# Patient Record
Sex: Female | Born: 1935 | Race: White | Hispanic: No | Smoking: Former smoker
Health system: Southern US, Community
[De-identification: ages and names within clinical notes are randomized; demographics above are authoritative.]

## PROBLEM LIST (undated history)

## (undated) DIAGNOSIS — I10 Essential (primary) hypertension: Secondary | ICD-10-CM

## (undated) DIAGNOSIS — D649 Anemia, unspecified: Secondary | ICD-10-CM

## (undated) DIAGNOSIS — R0600 Dyspnea, unspecified: Secondary | ICD-10-CM

## (undated) DIAGNOSIS — M1990 Unspecified osteoarthritis, unspecified site: Secondary | ICD-10-CM

## (undated) DIAGNOSIS — I739 Peripheral vascular disease, unspecified: Secondary | ICD-10-CM

## (undated) DIAGNOSIS — C801 Malignant (primary) neoplasm, unspecified: Secondary | ICD-10-CM

## (undated) HISTORY — PX: BREAST SURGERY: SHX581

## (undated) HISTORY — PX: KNEE SURGERY: SHX244

## (undated) HISTORY — PX: JOINT REPLACEMENT: SHX530

## (undated) HISTORY — DX: Unspecified osteoarthritis, unspecified site: M19.90

## (undated) HISTORY — PX: ABDOMINAL HYSTERECTOMY: SHX81

## (undated) HISTORY — PX: TONSILLECTOMY: SUR1361

## (undated) HISTORY — PX: OTHER SURGICAL HISTORY: SHX169

## (undated) HISTORY — PX: TUBAL LIGATION: SHX77

## (undated) HISTORY — PX: CARDIAC CATHETERIZATION: SHX172

## (undated) HISTORY — PX: CHOLECYSTECTOMY: SHX55

## (undated) HISTORY — PX: APPENDECTOMY: SHX54

## (undated) HISTORY — PX: CATARACT EXTRACTION: SUR2

## (undated) HISTORY — DX: Essential (primary) hypertension: I10

---

## 1968-12-30 HISTORY — PX: TUBAL LIGATION: SHX77

## 1983-12-31 HISTORY — PX: EYE SURGERY: SHX253

## 1994-12-30 HISTORY — PX: KNEE SURGERY: SHX244

## 2021-11-29 ENCOUNTER — Ambulatory Visit: Payer: Federal, State, Local not specified - PPO | Admitting: Orthopaedic Surgery

## 2021-11-29 ENCOUNTER — Encounter: Payer: Self-pay | Admitting: Orthopaedic Surgery

## 2021-11-29 ENCOUNTER — Other Ambulatory Visit: Payer: Self-pay

## 2021-11-29 DIAGNOSIS — M1612 Unilateral primary osteoarthritis, left hip: Secondary | ICD-10-CM | POA: Diagnosis not present

## 2021-11-29 NOTE — Progress Notes (Signed)
Office Visit Note   Patient: Meghan Miller           Date of Birth: [birth year]           MRN: [id number] Visit Date: 11/29/2021              Requested by: Neale Burly, MD [place],  [place] [address] PCP: Neale Burly, MD   Assessment & Plan: Visit Diagnoses:  1. Unilateral primary osteoarthritis, left hip     Plan: We discussed left total hip arthroplasty direct anterior approach.  She would require short-term skilled facility since she currently does not have an adult he could be with her after surgery.  She can find a neighbor or relative to help and this could be modified.  We discussed ambulation weightbearing as tolerated risks of surgery.  We discussed usual spinal anesthesia early ambulation aspirin DVT prophylaxis.  Questions were elicited and answered.  Decision for surgery was made.  Follow-Up Instructions: No follow-ups on file.   Orders:  No orders of the defined types were placed in this encounter.  No orders of the defined types were placed in this encounter.     Procedures: No procedures performed   Clinical Data: No additional findings.   Subjective: Chief Complaint  Patient presents with   Left Hip - Pain    HPI 85 year old female lives alone referred by Dr.Hasanaj for a left hip osteoarthritis.  She has bone-on-bone changes subchondral sclerosis and cystic formation with moderate opposite right hip arthritis by x-ray with right hip being asymptomatic.  Patient had taken some Aleve with some improvement was told to take Tylenol instead and she states that Tylenol does not not help.  She has had great difficulty walking with groin pain is difficulty getting straight.  Problems putting on her socks.  Pain radiates to the mid thigh.  X-rays listed below showed bone-on-bone changes of her hip.  She is using a cane due to the pain.  She states at times she has great difficulty walking in her house.  She is concerned about falling and  lives alone.  Patient's had left total knee and right total knee 1991 doing well previous hysterectomy gallbladder surgery.  Problems with bladder prolapse and states she had surgery for this with persistent problems.  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  She has had problems with cataracts hypertension in the past.  Some problems with back pain in the past.  Review of Systems all other systems noncontributory to HPI of note is hypertension cataract problems.   Objective: Vital Signs: Ht 5\' 4"  (1.626 m)   Wt 228 lb (103.4 kg)   BMI 39.14 kg/m   Physical Exam Constitutional:      Appearance: She is well-developed.  HENT:     Head: Normocephalic.     Right Ear: External ear normal.     Left Ear: External ear normal. There is no impacted cerumen.  Eyes:     Pupils: Pupils are equal, round, and reactive to light.  Neck:     Thyroid: No thyromegaly.     Trachea: No tracheal deviation.  Cardiovascular:     Rate and Rhythm: Normal rate.  Pulmonary:     Effort: Pulmonary effort is normal.  Abdominal:     Palpations: Abdomen is soft.  Musculoskeletal:     Cervical back: No rigidity.  Skin:    General: Skin is warm and dry.  Neurological:  Mental Status: She is alert and oriented to person, place, and time.  Psychiatric:        Behavior: Behavior normal.    Ortho Exam negative straight leg raising 90 degrees.  Reflexes are 2+ and symmetrical no sciatic notch tenderness.  10 degree internal rotation left hip with severe pain.  No pain with right hip range of motion.  Distal pulses are palpable.  Specialty Comments:  No specialty comments available.  Imaging: Narrative  CLINICAL DATA:  Left hip pain.   EXAM:  DG HIP (WITH OR WITHOUT PELVIS) 2-3V LEFT   COMPARISON:  None.   FINDINGS:  Both hips are normally located. Advanced left hip joint degenerative  changes and moderate to advanced right hip joint degenerative  changes. Significant joint  space narrowing, bony eburnation and  subchondral cystic change on the left. No fracture or AVN. The pubic  symphysis and SI joints are intact. No pelvic fractures or bone  lesions. Advanced degenerative disc disease noted in the lower  lumbar spine. Procedure Note  Pricilla Riffle, MD - 11/13/2021  Formatting of this note might be different from the original.  CLINICAL DATA:  Left hip pain.   EXAM:  DG HIP (WITH OR WITHOUT PELVIS) 2-3V LEFT   COMPARISON:  None.   FINDINGS:  Both hips are normally located. Advanced left hip joint degenerative  changes and moderate to advanced right hip joint degenerative  changes. Significant joint space narrowing, bony eburnation and  subchondral cystic change on the left. No fracture or AVN. The pubic  symphysis and SI joints are intact. No pelvic fractures or bone  lesions. Advanced degenerative disc disease noted in the lower  lumbar spine.   IMPRESSION:  Bilateral hip joint degenerative changes, left greater than right as  detailed above.   No acute bony findings.    Electronically Signed    By: Marijo Sanes M.D.    On: 11/13/2021 14:14   PMFS History: Patient Active Problem List   Diagnosis Date Noted   Unilateral primary osteoarthritis, left hip 11/29/2021   Past Medical History:  Diagnosis Date   Arthritis    Hypertension     No family history on file.  Past Surgical History:  Procedure Laterality Date   ABDOMINAL HYSTERECTOMY     bladder prolapse     CHOLECYSTECTOMY     JOINT REPLACEMENT     KNEE SURGERY     TUBAL LIGATION     Social History   Occupational History   Not on file  Tobacco Use   Smoking status: Former    Types: Cigarettes   Smokeless tobacco: Never  Substance and Sexual Activity   Alcohol use: Yes   Drug use: Not on file   Sexual activity: Not on file

## 2022-01-02 ENCOUNTER — Other Ambulatory Visit: Payer: Self-pay

## 2022-01-09 NOTE — Pre-Procedure Instructions (Signed)
Surgical Instructions    Your procedure is scheduled on Monday, January 16th.  Report to [facility] Main Entrance "A" at 09:00 A.M., then check in with the Admitting office.  Call this number if you have problems the morning of surgery:  [phone]   If you have any questions prior to your surgery date call [phone]: Open Monday-Friday 8am-4pm    Remember:  Do not eat after midnight the night before your surgery  You may drink clear liquids until 08:00 AM the morning of your surgery.   Clear liquids allowed are: Water, Non-Citrus Juices (without pulp), Carbonated Beverages, Clear Tea, Black Coffee Only, and Gatorade    Patient Instructions  The night before surgery:  No food after midnight. ONLY clear liquids after midnight  The day of surgery (if you do NOT have diabetes):  Drink ONE (1) Pre-Surgery Clear Ensure by 08:00 AM the morning of surgery. Drink in one sitting. Do not sip.  This drink was given to you during your hospital  pre-op appointment visit.  Nothing else to drink after completing the  Pre-Surgery Clear Ensure.         If you have questions, please contact your surgeons office.     Take these medicines the morning of surgery with A SIP OF WATER  carvedilol (COREG)   If needed: acetaminophen (TYLENOL)  fluticasone (FLONASE)   As of today, STOP taking any Aspirin (unless otherwise instructed by your surgeon) Aleve, Naproxen, Ibuprofen, Motrin, Advil, Goody's, BC's, all herbal medications, fish oil, and all vitamins.                     Do NOT Smoke (Tobacco/Vaping) or drink Alcohol 24 hours prior to your procedure.  If you use a CPAP at night, you may bring all equipment for your overnight stay.   Contacts, glasses, piercing's, hearing aid's, dentures or partials may not be worn into surgery, please bring cases for these belongings.    For patients admitted to the hospital, discharge time will be determined by your treatment team.   Patients  discharged the day of surgery will not be allowed to drive home, and someone needs to stay with them for 24 hours.  NO VISITORS WILL BE ALLOWED IN PRE-OP WHERE PATIENTS GET READY FOR SURGERY.  ONLY 1 SUPPORT PERSON MAY BE PRESENT IN THE WAITING ROOM WHILE YOU ARE IN SURGERY.  IF YOU ARE TO BE ADMITTED, ONCE YOU ARE IN YOUR ROOM YOU WILL BE ALLOWED TWO (2) VISITORS.  Minor children may have two parents present. Special consideration for safety and communication needs will be reviewed on a case by case basis.   Special instructions:   - Preparing For Surgery  Before surgery, you can play an important role. Because skin is not sterile, your skin needs to be as free of germs as possible. You can reduce the number of germs on your skin by washing with CHG (chlorahexidine gluconate) Soap before surgery.  CHG is an antiseptic cleaner which kills germs and bonds with the skin to continue killing germs even after washing.    Oral Hygiene is also important to reduce your risk of infection.  Remember - BRUSH YOUR TEETH THE MORNING OF SURGERY WITH YOUR REGULAR TOOTHPASTE  Please do not use if you have an allergy to CHG or antibacterial soaps. If your skin becomes reddened/irritated stop using the CHG.  Do not shave (including legs and underarms) for at least 48 hours prior to first CHG  shower. It is OK to shave your face.  Please follow these instructions carefully.   Shower the NIGHT BEFORE SURGERY and the MORNING OF SURGERY  If you chose to wash your hair, wash your hair first as usual with your normal shampoo.  After you shampoo, rinse your hair and body thoroughly to remove the shampoo.  Use CHG Soap as you would any other liquid soap. You can apply CHG directly to the skin and wash gently with a scrungie or a clean washcloth.   Apply the CHG Soap to your body ONLY FROM THE NECK DOWN.  Do not use on open wounds or open sores. Avoid contact with your eyes, ears, mouth and genitals (private  parts). Wash Face and genitals (private parts)  with your normal soap.   Wash thoroughly, paying special attention to the area where your surgery will be performed.  Thoroughly rinse your body with warm water from the neck down.  DO NOT shower/wash with your normal soap after using and rinsing off the CHG Soap.  Pat yourself dry with a CLEAN TOWEL.  Wear CLEAN PAJAMAS to bed the night before surgery  Place CLEAN SHEETS on your bed the night before your surgery  DO NOT SLEEP WITH PETS.   Day of Surgery: Shower with CHG soap. Do not wear jewelry, make up, nail polish, gel polish, artificial nails, or any other type of covering on natural nails including finger and toenails. If patients have artificial nails, gel coating, etc. that need to be removed by a nail salon please have this removed prior to surgery. Surgery may need to be canceled/delayed if the surgeon/ anesthesia feels like the patient is unable to be adequately monitored. Do not wear lotions, powders, perfumes, or deodorant. Do not shave 48 hours prior to surgery.   Do not bring valuables to the hospital. [facility] is not responsible for any belongings or valuables. Wear Clean/Comfortable clothing the morning of surgery Remember to brush your teeth WITH YOUR REGULAR TOOTHPASTE.   Please read over the following fact sheets that you were given.   3 days prior to your procedure or After your COVID test   You are not required to quarantine however you are required to wear a well-fitting mask when you are out and around people not in your household. If your mask becomes wet or soiled, replace with a new one.   Wash your hands often with soap and water for 20 seconds or clean your hands with an alcohol-based hand sanitizer that contains at least 60% alcohol.   Do not share personal items.   Notify your provider:  o if you are in close contact with someone who has COVID  o or if you develop a fever of 100.4 or  greater, sneezing, cough, sore throat, shortness of breath or body aches.

## 2022-01-10 ENCOUNTER — Encounter (HOSPITAL_COMMUNITY): Payer: Self-pay

## 2022-01-10 ENCOUNTER — Other Ambulatory Visit: Payer: Self-pay

## 2022-01-10 ENCOUNTER — Encounter: Payer: Self-pay | Admitting: Surgery

## 2022-01-10 ENCOUNTER — Ambulatory Visit (HOSPITAL_COMMUNITY): Admission: RE | Admit: 2022-01-10 | Payer: Federal, State, Local not specified - PPO | Source: Ambulatory Visit

## 2022-01-10 ENCOUNTER — Encounter (HOSPITAL_COMMUNITY)
Admission: RE | Admit: 2022-01-10 | Discharge: 2022-01-10 | Disposition: A | Discharge status: Home / Self Care | Payer: Federal, State, Local not specified - PPO | Source: Ambulatory Visit | Attending: Orthopaedic Surgery | Admitting: Orthopaedic Surgery

## 2022-01-10 ENCOUNTER — Ambulatory Visit (INDEPENDENT_AMBULATORY_CARE_PROVIDER_SITE_OTHER): Payer: Federal, State, Local not specified - PPO | Admitting: Surgery

## 2022-01-10 VITALS — BP 138/64 | HR 73 | Temp 98.1°F

## 2022-01-10 DIAGNOSIS — Z87891 Personal history of nicotine dependence: Secondary | ICD-10-CM | POA: Diagnosis not present

## 2022-01-10 DIAGNOSIS — Z01818 Encounter for other preprocedural examination: Secondary | ICD-10-CM | POA: Insufficient documentation

## 2022-01-10 DIAGNOSIS — M1612 Unilateral primary osteoarthritis, left hip: Secondary | ICD-10-CM

## 2022-01-10 DIAGNOSIS — Z85828 Personal history of other malignant neoplasm of skin: Secondary | ICD-10-CM | POA: Diagnosis not present

## 2022-01-10 DIAGNOSIS — R0600 Dyspnea, unspecified: Secondary | ICD-10-CM | POA: Insufficient documentation

## 2022-01-10 DIAGNOSIS — Z20822 Contact with and (suspected) exposure to covid-19: Secondary | ICD-10-CM | POA: Insufficient documentation

## 2022-01-10 DIAGNOSIS — I10 Essential (primary) hypertension: Secondary | ICD-10-CM | POA: Diagnosis not present

## 2022-01-10 DIAGNOSIS — I2510 Atherosclerotic heart disease of native coronary artery without angina pectoris: Secondary | ICD-10-CM | POA: Diagnosis not present

## 2022-01-10 HISTORY — DX: Malignant (primary) neoplasm, unspecified: C80.1

## 2022-01-10 HISTORY — DX: Dyspnea, unspecified: R06.00

## 2022-01-10 LAB — TYPE AND SCREEN
ABO/RH(D): A NEG
Antibody Screen: NEGATIVE

## 2022-01-10 LAB — SARS CORONAVIRUS 2 (TAT 6-24 HRS): SARS Coronavirus 2: NEGATIVE

## 2022-01-10 LAB — COMPREHENSIVE METABOLIC PANEL
ALT: 17 U/L (ref 0–44)
AST: 19 U/L (ref 15–41)
Albumin: 3.4 g/dL — ABNORMAL LOW (ref 3.5–5.0)
Alkaline Phosphatase: 61 U/L (ref 38–126)
Anion gap: 8 (ref 5–15)
BUN: 17 mg/dL (ref 8–23)
CO2: 36 mmol/L — ABNORMAL HIGH (ref 22–32)
Calcium: 10.3 mg/dL (ref 8.9–10.3)
Chloride: 97 mmol/L — ABNORMAL LOW (ref 98–111)
Creatinine, Ser: 0.97 mg/dL (ref 0.44–1.00)
GFR, Estimated: 58 mL/min — ABNORMAL LOW (ref >60)
Glucose, Bld: 88 mg/dL (ref 70–99)
Potassium: 3.3 mmol/L — ABNORMAL LOW (ref 3.5–5.1)
Sodium: 141 mmol/L (ref 135–145)
Total Bilirubin: 0.4 mg/dL (ref 0.3–1.2)
Total Protein: 7.3 g/dL (ref 6.5–8.1)

## 2022-01-10 LAB — CBC
HCT: 39 % (ref 36.0–46.0)
Hemoglobin: 11.8 g/dL — ABNORMAL LOW (ref 12.0–15.0)
MCH: 23.6 pg — ABNORMAL LOW (ref 26.0–34.0)
MCHC: 30.3 g/dL (ref 30.0–36.0)
MCV: 78.2 fL — ABNORMAL LOW (ref 80.0–100.0)
Platelets: 291 10*3/uL (ref 150–400)
RBC: 4.99 MIL/uL (ref 3.87–5.11)
RDW: 17.1 % — ABNORMAL HIGH (ref 11.5–15.5)
WBC: 8.4 10*3/uL (ref 4.0–10.5)
nRBC: 0 % (ref 0.0–0.2)

## 2022-01-10 LAB — SURGICAL PCR SCREEN
MRSA, PCR: NEGATIVE
Staphylococcus aureus: NEGATIVE

## 2022-01-10 NOTE — Progress Notes (Signed)
PCP - Dr. Stoney Bang Cardiologist - Pt states she last saw a cardiologist in [place]. She just moved here within the past year. She states she only saw cardio due to "leg swelling." Last office visit note requested from PCP.  PPM/ICD - denies   Chest x-ray - 01/10/22 at PAT EKG - 01/10/22 at PAT Stress Test - Pt states she had one in 2022, no abnormalities ECHO - Pt states she had one in 2022, no abnormalities Cardiac Cath - Pt states she had one between [place]. She states there "was some blockage in one artery but the Dr was not concerned"  Sleep Study - denies  DM- denies  Blood Thinner Instructions: n/a Aspirin Instructions: n/a  ERAS Protcol - yes PRE-SURGERY Ensure given at PAT  COVID TEST- 01/10/22 at PAT   Anesthesia review: yes, last OV noted requested from PCP due to questionable cardiac history.  Patient denies shortness of breath, fever, cough and chest pain at PAT appointment   All instructions explained to the patient, with a verbal understanding of the material. Patient agrees to go over the instructions while at home for a better understanding. Patient also instructed to wear a mask in public after being tested for COVID-19. The opportunity to ask questions was provided.

## 2022-01-11 ENCOUNTER — Encounter (HOSPITAL_COMMUNITY): Payer: Self-pay | Admitting: Vascular Surgery

## 2022-01-11 NOTE — Progress Notes (Signed)
86 year old white female history of end-stage DJD left hip and pain comes in for preop evaluation.  States that hip symptoms unchanged from previous visit.  She is want to proceed with left total hip arthroplasty as scheduled.  Today history and physical performed.  Review of systems negative.  Patient states that she had previous bladder prolapse surgery and still has some issues with this.  Patient lives alone and states that her husband passed away 2021/05/23.  Surgical procedure discussed along with potential rehab/recovery time.  Patient may need short skilled nurse facility placement postop for rehab since she does live alone.  We will see how she does.

## 2022-01-11 NOTE — Progress Notes (Signed)
Anesthesia Chart Review:  Case: [id number] Date/Time: 01/14/22 1045   Procedure: LEFT TOTAL HIP ARTHROPLASTY ANTERIOR APPROACH (Left: Hip)   Anesthesia type: Spinal   Pre-op diagnosis: left hip osteoarthritis   Location: [facility] OR ROOM 05 / [place] OR   Surgeons: [name], MD       DISCUSSION: Patient is an 86 year old female scheduled for the above procedure.  History includes former smoker (quit 12/31/87), HTN, dyspnea, skin cancer. Patient reported history of what sounds like cardiac cath sometime between 1985-1995 that showed 1V non-obstructive CAD that was not concerning at the time. She reported last cardiology evaluation was in [place] due to "leg swelling" and said she had a stress test and echocardiogram showing no abnormalities.   PAT RN requested PCP records from Dr. Sherrie Sport. She first got established with him ~ 10/2021. Last visit was on 01/08/22 and was treated for bronchitis (persistent symptoms for 10 days). Prescribed 5 days of doxycycline and as needed Mucinex MD. Dr. Sherrie Sport notes her plans for left THA on 01/14/22, and he signed a note of medical clearance on 01/09/22.  Her 01/21/22 COVID-19 test was negative. WBC 8.4. O2 sat 93%. Patient was supposed to get a CXR while at her 01/10/22 PAT visit, but she left without getting one.   I spoke with staff at Dr. Joselyn Arrow office and was told they do not have copies of any cardiology notes or testings. I left a voice message for patient to call me earlier today, but had not received a call back yet. Discussed vague cardiac history with reported normal testing last year and recent treatment for bronchitis with anesthesiologist Criss Rosales, DO. 01/08/22 notes from Dr. Sherrie Sport indicate patient had "Trail" and still have cough and dyspnea. Recommend patient postpone surgery for 4 weeks to allow time to recover. Also if unable to confirm that patient had recent reassuring cardiac testing, then would advise preoperative cardiology  evaluation as well. I have notified [place] at Dr. Lorin Mercy' office.    VS: BP (!) 156/76    Pulse 71    Temp 36.6 C (Oral)    Resp 19    Ht 5\' 7"  (1.702 m)    Wt 103.8 kg    SpO2 93%    BMI 35.84 kg/m    PROVIDERS: Neale Burly, MD is PCP [facility], established ~ 10/2021).    LABS: Labs reviewed: Acceptable for surgery. (all labs ordered are listed, but only abnormal results are displayed)  Labs Reviewed  CBC - Abnormal; Notable for the following components:      Result Value   Hemoglobin 11.8 (*)    MCV 78.2 (*)    MCH 23.6 (*)    RDW 17.1 (*)    All other components within normal limits  COMPREHENSIVE METABOLIC PANEL - Abnormal; Notable for the following components:   Potassium 3.3 (*)    Chloride 97 (*)    CO2 36 (*)    Albumin 3.4 (*)    GFR, Estimated 58 (*)    All other components within normal limits  SURGICAL PCR SCREEN  SARS CORONAVIRUS 2 (TAT 6-24 HRS)  TYPE AND SCREEN     IMAGES: Left Hip with pelvis xray ([facility]): Impression: - Bilateral hip joint degenerative changes, left greater than right as  detailed above.  - No acute bony findings.    EKG: 01/10/22: Normal sinus rhythm Low voltage QRS Borderline ECG No previous ECGs available Confirmed by Kirk Ruths [id number]) on  01/10/2022 3:33:27 PM   CV: Reported seeing a cardiologist in [place] in 2022 for "leg swelling" and had an unremarkable stress and echo. She also reported a cardiac cath between 1985-1995 with 1V non-obstructive CAD. I do not currently have access to these records to review.    Past Medical History:  Diagnosis Date   Arthritis    Cancer ([place])    skin cancer- pt has had 2 spots removed from face/ear area (2020 and 2022)   Dyspnea    Hypertension     Past Surgical History:  Procedure Laterality Date   ABDOMINAL HYSTERECTOMY     APPENDECTOMY     appendix removed with tubal ligation   bladder prolapse     CARDIAC CATHETERIZATION     between 1985-1995.  Pt states she had "some blockage in one artery but there was no concern"   CHOLECYSTECTOMY     EYE SURGERY Bilateral 1985   cataract extraction   JOINT REPLACEMENT     both knees replaced in [place]   revision   TONSILLECTOMY     removed as a child   TUBAL LIGATION  1970    MEDICATIONS:  acetaminophen (TYLENOL) 500 MG tablet   betamethasone, augmented, (DIPROLENE) 0.05 % lotion   carvedilol (COREG) 6.25 MG tablet   Cholecalciferol (VITAMIN D) 125 MCG (5000 UT) CAPS   fluticasone (FLONASE) 50 MCG/ACT nasal spray   hydrochlorothiazide (HYDRODIURIL) 25 MG tablet   losartan (COZAAR) 100 MG tablet   potassium chloride SA (KLOR-CON M) 20 MEQ tablet   vitamin B-12 (CYANOCOBALAMIN) 500 MCG tablet   No current facility-administered medications for this encounter.    Myra Gianotti, PA-C Surgical Short Stay/Anesthesiology [facility] Phone [phone] [facility] Phone [phone] 01/11/2022 2:30 PM

## 2022-01-14 ENCOUNTER — Encounter (HOSPITAL_COMMUNITY): Admission: RE | Payer: Self-pay | Source: Home / Self Care

## 2022-01-14 ENCOUNTER — Inpatient Hospital Stay (HOSPITAL_COMMUNITY)
Admission: RE | Admit: 2022-01-14 | Payer: Federal, State, Local not specified - PPO | Source: Home / Self Care | Admitting: Orthopaedic Surgery

## 2022-01-14 DIAGNOSIS — Z01818 Encounter for other preprocedural examination: Secondary | ICD-10-CM

## 2022-01-14 SURGERY — ARTHROPLASTY, HIP, TOTAL, ANTERIOR APPROACH
Anesthesia: Spinal | Site: Hip | Laterality: Left

## 2022-01-24 ENCOUNTER — Encounter: Payer: Federal, State, Local not specified - PPO | Admitting: Orthopaedic Surgery

## 2022-02-28 ENCOUNTER — Telehealth (HOSPITAL_COMMUNITY): Payer: Self-pay | Admitting: Vascular Surgery

## 2022-02-28 NOTE — Progress Notes (Addendum)
Anesthesia follow-up:  ?See my previous note from Date of Service 01/10/22. Patient was scheduled for left THA with Dr. Lorin Mercy, but on 01/08/22 she was treated for bronchitis with persistent cough and wheezing. Postponing surgery for 4 weeks recommended. Also recommended re-evaluation by her PCP Neale Burly, MD given vague cardiac history of 1V CAD from likely > 30 years ago, but no current chest pain symptoms. She had reported cardiology evaluation in [place] about a year prior for leg swelling. She thought she might have had a stress test and echo that were normal, but we were unable to confirm which tests were done. Reportedly done through [facility] in [place], [place]. [facility] does have limited record review in [place] which include summaries of primary care encounters at [facility]. There is also a 06/25/21 encounter through [facility] but no note is currently viewable. No cardiac testing noted in Care Everywhere other than the recently ordered echo by Dr. Sherrie Sport. ? ?Reportedly, since surgery previously cancelled, she has had re-evaluation by Dr. Sherrie Sport with antihypertensive medication adjustment and echocardiogram done on 02/07/22 showing normal LV systolic function, EF > 88%, grade II diastolic dysfunction, normal RV size and function, no significant valvular disease. It sounds like this was done as part of a preoperative evaluation. Patient has contacted Dr. Lorin Mercy' office inquiring about when she can reschedule surgery. Advised Sherrie that Dr. Sherrie Sport will need to update surgical clearance. She will reach out to his office. I have also requested records. Discussed available information with anesthesiologist Albertha Ghee, MD. Recent primary care evaluation and normal LV/RV function on last month's echo. Would defer decision for additional testing/referral, if any, to Dr. Sherrie Sport. Will await his input, and once she is cleared would anticipate that she could be rescheduled if otherwise  no acute changes.   ?  ?Echo 02/07/22 [facility]): ?Summary  ?  1. Technically difficult study.  ?  2. The left ventricle is normal in size with normal wall thickness.  ?  3. The left ventricular systolic function is normal, LVEF is visually  ?estimated at > 55%.  ?  4. There is grade II diastolic dysfunction (elevated filling pressure).  ?  5. The aortic valve is trileaflet with mildly thickened leaflets with normal  ?excursion.  ?  6. The left atrium is mildly dilated in size.  ?  7. The right ventricle is normal in size, with normal systolic function.   ? ? ?Myra Gianotti, PA-C ?Surgical Short Stay/Anesthesiology ?[facility] Phone [phone] ?[facility] Phone [phone] ?02/28/2022 5:14 PM ? ?UPDATE 02/28/22 5:30 PM: ?Received a surgical clearance letter from Dr. Sherrie Sport clearing patient for left THA from both a medical and cardiac standpoint. ? ?Myra Gianotti, PA-C ?Surgical Short Stay/Anesthesiology ?[facility] Phone [phone] ?[facility] Phone [phone] ?02/28/2022 5:32 PM ? ? ?

## 2022-03-11 ENCOUNTER — Telehealth: Payer: Self-pay

## 2022-03-11 NOTE — Telephone Encounter (Signed)
Meghan Miller calling  [phone] ?Stating they sent clearance but patient has still not heard anything about surgery, checking on this for patient  ?

## 2022-03-12 NOTE — Telephone Encounter (Signed)
All information obtained and forwarded to Anesthesia.  I called and rescheduled surgery with patient for 04/01/22. ?

## 2022-03-12 NOTE — Telephone Encounter (Signed)
Anesthesia at [facility] is still waiting on ECHO report they requested.  I called Amber and advised.  She is supposed to be e-mailing it to me.  We can then proceed with rescheduling surgery. ?

## 2022-03-22 NOTE — Pre-Procedure Instructions (Signed)
Surgical Instructions ?  ?  ?            Your procedure is scheduled on Monday, January 16th. ?            Report to [facility] Main Entrance "A" at 10:30 A.M., then check in with the Admitting office. ?            Call this number if you have problems the morning of surgery: ?            [phone] ?  ? If you have any questions prior to your surgery date call [phone]: Open Monday-Friday 8am-4pm ?  ?  ?            Remember: ?            Do not eat after midnight the night before your surgery ?  ?You may drink clear liquids until 9:30 AM the morning of your surgery.   ?Clear liquids allowed are: Water, Non-Citrus Juices (without pulp), Carbonated Beverages, Clear Tea, Black Coffee Only, and Gatorade ?             ?  ?Patient Instructions ?  ?The night before surgery:  ?No food after midnight. ONLY clear liquids after midnight ?  ?The day of surgery (if you do NOT have diabetes):  ?Drink ONE (1) Pre-Surgery Clear Ensure by 9:30 AM the morning of surgery. Drink in one sitting. Do not sip.  ?This drink was given to you during your hospital  ?pre-op appointment visit. ?  ?Nothing else to drink after completing the  ?Pre-Surgery Clear Ensure. ?  ?       If you have questions, please contact your surgeon?s office.  ?  ?  ?            Take these medicines the morning of surgery with A SIP OF WATER  ?carvedilol (COREG) ?amLODipine (NORVASC) ?fluticasone (FLONASE) ?fesoterodine (TOVIAZ)  ? ?If needed: ?acetaminophen (TYLENOL)  ? ?As of today, STOP taking any Aspirin (unless otherwise instructed by your surgeon) Aleve, Naproxen, Ibuprofen, Motrin, Advil, Goody's, BC's, all herbal medications, fish oil, and all vitamins. ?         ?           ?Do NOT Smoke (Tobacco/Vaping) or drink Alcohol 24 hours prior to your procedure. ?  ?If you use a CPAP at night, you may bring all equipment for your overnight stay. ?  ?Contacts, glasses, piercing's, hearing aid's, dentures or partials may not be worn into surgery, please bring  cases for these belongings.  ?  ?For patients admitted to the hospital, discharge time will be determined by your treatment team. ?  ?Patients discharged the day of surgery will not be allowed to drive home, and someone needs to stay with them for 24 hours. ?  ?SURGICAL WAITING ROOM VISITATION ?Patients having surgery or a procedure may have two support people in the waiting room. These visitors may be switched out with other visitors if needed. ?Children under the age of 49 must have an adult accompany them who is not the patient. ?If the patient needs to stay at the hospital during part of their recovery, the visitor guidelines for inpatient rooms apply. ? ?Please refer to the [place] website for the visitor guidelines for Inpatients (after your surgery is over and you are in a regular room).  ?  ?Special instructions:   ?[place]- Preparing For Surgery ?  ?Before surgery, you can play an  important role. Because skin is not sterile, your skin needs to be as free of germs as possible. You can reduce the number of germs on your skin by washing with CHG (chlorahexidine gluconate) Soap before surgery.  CHG is an antiseptic cleaner which kills germs and bonds with the skin to continue killing germs even after washing.   ?  ?Oral Hygiene is also important to reduce your risk of infection.  Remember - BRUSH YOUR TEETH THE MORNING OF SURGERY WITH YOUR REGULAR TOOTHPASTE ?  ?Please do not use if you have an allergy to CHG or antibacterial soaps. If your skin becomes reddened/irritated stop using the CHG.  ?Do not shave (including legs and underarms) for at least 48 hours prior to first CHG shower. It is OK to shave your face. ?  ?Please follow these instructions carefully. ?                                                                                                                              ?Shower the NIGHT BEFORE SURGERY and the MORNING OF SURGERY ?  ?If you chose to wash your hair, wash your hair first as  usual with your normal shampoo. ?  ?After you shampoo, rinse your hair and body thoroughly to remove the shampoo. ?  ?Use CHG Soap as you would any other liquid soap. You can apply CHG directly to the skin and wash gently with a scrungie or a clean washcloth.  ?  ?Apply the CHG Soap to your body ONLY FROM THE NECK DOWN.  Do not use on open wounds or open sores. Avoid contact with your eyes, ears, mouth and genitals (private parts). Wash Face and genitals (private parts)  with your normal soap.  ?  ?Wash thoroughly, paying special attention to the area where your surgery will be performed. ?  ?Thoroughly rinse your body with warm water from the neck down. ?  ?DO NOT shower/wash with your normal soap after using and rinsing off the CHG Soap. ?  ?Pat yourself dry with a CLEAN TOWEL. ?  ?Wear CLEAN PAJAMAS to bed the night before surgery ?  ?Place CLEAN SHEETS on your bed the night before your surgery ?  ?DO NOT SLEEP WITH PETS. ?  ?  ?Day of Surgery: ?Shower with CHG soap. ?Do not wear jewelry, make up, nail polish, gel polish, artificial nails, or any other type of covering on natural nails including finger and toenails. ?If patients have artificial nails, gel coating, etc. that need to be removed by a nail salon please have this removed prior to surgery. Surgery may need to be canceled/delayed if the surgeon/ anesthesia feels like the patient is unable to be adequately monitored. ?Do not wear lotions, powders, perfumes, or deodorant. ?Do not shave 48 hours prior to surgery.   ?Do not bring valuables to the hospital. [facility] is not responsible for any belongings or valuables. ?Wear Clean/Comfortable clothing the morning of  surgery ?Remember to brush your teeth WITH YOUR REGULAR TOOTHPASTE. ?  ?Please read over the following fact sheets that you were given. ? ? ?If you received a COVID test during your pre-op visit  it is requested that you wear a mask when out in public, stay away from anyone that may not be  feeling well and notify your surgeon if you develop symptoms. If you have been in contact with anyone that has tested positive in the last 10 days please notify you surgeon. ? ?

## 2022-03-25 ENCOUNTER — Encounter (HOSPITAL_COMMUNITY)
Admission: RE | Admit: 2022-03-25 | Discharge: 2022-03-25 | Disposition: A | Discharge status: Home / Self Care | Payer: Federal, State, Local not specified - PPO | Source: Ambulatory Visit | Attending: Orthopaedic Surgery | Admitting: Orthopaedic Surgery

## 2022-03-25 ENCOUNTER — Other Ambulatory Visit: Payer: Self-pay

## 2022-03-25 ENCOUNTER — Encounter (HOSPITAL_COMMUNITY): Payer: Self-pay

## 2022-03-25 VITALS — BP 152/81 | HR 85 | Temp 97.8°F | Resp 18 | Ht 65.0 in | Wt 229.0 lb

## 2022-03-25 DIAGNOSIS — I2510 Atherosclerotic heart disease of native coronary artery without angina pectoris: Secondary | ICD-10-CM

## 2022-03-25 DIAGNOSIS — I872 Venous insufficiency (chronic) (peripheral): Secondary | ICD-10-CM | POA: Diagnosis not present

## 2022-03-25 DIAGNOSIS — Z87891 Personal history of nicotine dependence: Secondary | ICD-10-CM | POA: Insufficient documentation

## 2022-03-25 DIAGNOSIS — R52 Pain, unspecified: Secondary | ICD-10-CM | POA: Diagnosis not present

## 2022-03-25 DIAGNOSIS — Z79899 Other long term (current) drug therapy: Secondary | ICD-10-CM | POA: Insufficient documentation

## 2022-03-25 DIAGNOSIS — I10 Essential (primary) hypertension: Secondary | ICD-10-CM | POA: Insufficient documentation

## 2022-03-25 DIAGNOSIS — E669 Obesity, unspecified: Secondary | ICD-10-CM | POA: Insufficient documentation

## 2022-03-25 DIAGNOSIS — M1612 Unilateral primary osteoarthritis, left hip: Secondary | ICD-10-CM

## 2022-03-25 DIAGNOSIS — Z85828 Personal history of other malignant neoplasm of skin: Secondary | ICD-10-CM | POA: Insufficient documentation

## 2022-03-25 DIAGNOSIS — Z01818 Encounter for other preprocedural examination: Secondary | ICD-10-CM

## 2022-03-25 DIAGNOSIS — R0600 Dyspnea, unspecified: Secondary | ICD-10-CM | POA: Insufficient documentation

## 2022-03-25 DIAGNOSIS — R600 Localized edema: Secondary | ICD-10-CM | POA: Insufficient documentation

## 2022-03-25 DIAGNOSIS — Z01812 Encounter for preprocedural laboratory examination: Secondary | ICD-10-CM | POA: Diagnosis present

## 2022-03-25 DIAGNOSIS — Z6838 Body mass index (BMI) 38.0-38.9, adult: Secondary | ICD-10-CM | POA: Insufficient documentation

## 2022-03-25 HISTORY — DX: Anemia, unspecified: D64.9

## 2022-03-25 HISTORY — DX: Peripheral vascular disease, unspecified: I73.9

## 2022-03-25 LAB — CBC
HCT: 41.5 % (ref 36.0–46.0)
Hemoglobin: 12.3 g/dL (ref 12.0–15.0)
MCH: 24.1 pg — ABNORMAL LOW (ref 26.0–34.0)
MCHC: 29.6 g/dL — ABNORMAL LOW (ref 30.0–36.0)
MCV: 81.2 fL (ref 80.0–100.0)
Platelets: 259 10*3/uL (ref 150–400)
RBC: 5.11 MIL/uL (ref 3.87–5.11)
RDW: 16.7 % — ABNORMAL HIGH (ref 11.5–15.5)
WBC: 8.4 10*3/uL (ref 4.0–10.5)
nRBC: 0 % (ref 0.0–0.2)

## 2022-03-25 LAB — BASIC METABOLIC PANEL
Anion gap: 8 (ref 5–15)
BUN: 23 mg/dL (ref 8–23)
CO2: 28 mmol/L (ref 22–32)
Calcium: 10.1 mg/dL (ref 8.9–10.3)
Chloride: 101 mmol/L (ref 98–111)
Creatinine, Ser: 0.81 mg/dL (ref 0.44–1.00)
GFR, Estimated: >60 mL/min (ref >60)
Glucose, Bld: 110 mg/dL — ABNORMAL HIGH (ref 70–99)
Potassium: 4.2 mmol/L (ref 3.5–5.1)
Sodium: 137 mmol/L (ref 135–145)

## 2022-03-25 LAB — HEPATIC FUNCTION PANEL
ALT: 13 U/L (ref 0–44)
AST: 24 U/L (ref 15–41)
Albumin: 3.7 g/dL (ref 3.5–5.0)
Alkaline Phosphatase: 64 U/L (ref 38–126)
Bilirubin, Direct: 0.3 mg/dL — ABNORMAL HIGH (ref 0.0–0.2)
Indirect Bilirubin: 0.6 mg/dL (ref 0.3–0.9)
Total Bilirubin: 0.9 mg/dL (ref 0.3–1.2)
Total Protein: 7.6 g/dL (ref 6.5–8.1)

## 2022-03-25 LAB — SURGICAL PCR SCREEN
MRSA, PCR: NEGATIVE
Staphylococcus aureus: NEGATIVE

## 2022-03-25 NOTE — Pre-Procedure Instructions (Addendum)
Surgical Instructions ?  ?  ?            Your procedure is scheduled on Monday, April 3 ?            Report to [facility] Main Entrance "A" at 10:30 A.M., then check in with the Admitting office. ?            Call this number if you have problems the morning of surgery: ?            [phone] ?  ? If you have any questions prior to your surgery date call [phone]: Open Monday-Friday 8am-4pm ?  ?  ?            Remember: ?            Do not eat after midnight the night before your surgery ?  ?You may drink clear liquids until 9:30 AM the morning of your surgery.   ?Clear liquids allowed are: Water, Non-Citrus Juices (without pulp), Carbonated Beverages, Clear Tea, Black Coffee Only, and Gatorade ?             ?  ? ?  ?  ?            Take these medicines the morning of surgery with A SIP OF WATER  ?carvedilol (COREG) ?amLODipine (NORVASC) ?fluticasone (FLONASE) ?fesoterodine (TOVIAZ)  ? ?If needed: ?acetaminophen (TYLENOL)  ? ?As of today, STOP taking any Aspirin (unless otherwise instructed by your surgeon) Aleve, Naproxen, Ibuprofen, Motrin, Advil, Goody's, BC's, all herbal medications, fish oil, and all vitamins. ?         ?           ?Do NOT Smoke (Tobacco/Vaping) or drink Alcohol 24 hours prior to your procedure. ?  ?If you use a CPAP at night, you may bring all equipment for your overnight stay. ?  ?Contacts, glasses, piercing's, hearing aid's, dentures or partials may not be worn into surgery, please bring cases for these belongings.  ?  ?For patients admitted to the hospital, discharge time will be determined by your treatment team. ?  ?Patients discharged the day of surgery will not be allowed to drive home, and someone needs to stay with them for 24 hours. ?  ?SURGICAL WAITING ROOM VISITATION ?Patients having surgery or a procedure may have two support people in the waiting room. These visitors may be switched out with other visitors if needed. ?Children under the age of 53 must have an adult accompany  them who is not the patient. ?If the patient needs to stay at the hospital during part of their recovery, the visitor guidelines for inpatient rooms apply. ? ?Please refer to the [place] website for the visitor guidelines for Inpatients (after your surgery is over and you are in a regular room).  ?  ?Special instructions:   ?[place]- Preparing For Surgery ?  ?Before surgery, you can play an important role. Because skin is not sterile, your skin needs to be as free of germs as possible. You can reduce the number of germs on your skin by washing with CHG (chlorahexidine gluconate) Soap before surgery.  CHG is an antiseptic cleaner which kills germs and bonds with the skin to continue killing germs even after washing.   ?  ?Oral Hygiene is also important to reduce your risk of infection.  Remember - BRUSH YOUR TEETH THE MORNING OF SURGERY WITH YOUR REGULAR TOOTHPASTE ?  ?Please do not use if you have  an allergy to CHG or antibacterial soaps. If your skin becomes reddened/irritated stop using the CHG.  ?Do not shave (including legs and underarms) for at least 48 hours prior to first CHG shower. It is OK to shave your face. ?  ?Please follow these instructions carefully. ?                                                                                                                              ?Shower the NIGHT BEFORE SURGERY and the MORNING OF SURGERY ?  ?If you chose to wash your hair, wash your hair first as usual with your normal shampoo. ?  ?After you shampoo, rinse your hair and body thoroughly to remove the shampoo. ?  ?Use CHG Soap as you would any other liquid soap. You can apply CHG directly to the skin and wash gently with a scrungie or a clean washcloth.  ?  ?Apply the CHG Soap to your body ONLY FROM THE NECK DOWN.  Do not use on open wounds or open sores. Avoid contact with your eyes, ears, mouth and genitals (private parts). Wash Face and genitals (private parts)  with your normal soap.  ?  ?Wash  thoroughly, paying special attention to the area where your surgery will be performed. ?  ?Thoroughly rinse your body with warm water from the neck down. ?  ?DO NOT shower/wash with your normal soap after using and rinsing off the CHG Soap. ?  ?Pat yourself dry with a CLEAN TOWEL. ?  ?Wear CLEAN PAJAMAS to bed the night before surgery ?  ?Place CLEAN SHEETS on your bed the night before your surgery ?  ?DO NOT SLEEP WITH PETS. ?  ?  ?Day of Surgery: ?Shower with CHG soap. ?Do not wear jewelry, make up, nail polish, gel polish, artificial nails, or any other type of covering on natural nails including finger and toenails. ?If patients have artificial nails, gel coating, etc. that need to be removed by a nail salon please have this removed prior to surgery. Surgery may need to be canceled/delayed if the surgeon/ anesthesia feels like the patient is unable to be adequately monitored. ?Do not wear lotions, powders, perfumes, or deodorant. ?Do not shave 48 hours prior to surgery.   ?Do not bring valuables to the hospital. [facility] is not responsible for any belongings or valuables. ?Wear Clean/Comfortable clothing the morning of surgery ?Remember to brush your teeth WITH YOUR REGULAR TOOTHPASTE. ?  ?Please read over the following fact sheets that you were given. ? ? ?If you received a COVID test during your pre-op visit  it is requested that you wear a mask when out in public, stay away from anyone that may not be feeling well and notify your surgeon if you develop symptoms. If you have been in contact with anyone that has tested positive in the last 10 days please notify you surgeon. ? ?

## 2022-03-25 NOTE — Progress Notes (Signed)
PCP - Dr Stoney Bang ?Cardiologist - denies- pt states she has not seen cardiologist since she moved from [place]- pt states Dr. Sherrie Sport did recently do an ECHO ? ?PPM/ICD - denies ? ? ?Chest x-ray - Not ordered ?EKG - 01/10/22 ?Stress Test - pt reports normal stress test in [place] in 2022 ?ECHO - 02/07/22 ?Cardiac Cath - Pt reports having cath that was "normal" around 1985.  ? ?Sleep Study - denies ? ?Aspirin Instructions: pt does not take ASA ? ?ERAS Protcol - no order per surgery team- ERAS per protocol without drink ? ?COVID TEST- N/A ? ? ?Anesthesia review: see previous notes from [place] in epic.  ? ?Patient denies shortness of breath, fever, cough and chest pain at PAT appointment ? ? ?All instructions explained to the patient, with a verbal understanding of the material. Patient agrees to go over the instructions while at home for a better understanding. Patient also instructed to self quarantine after being tested for COVID-19. The opportunity to ask questions was provided. ? ? ?

## 2022-03-26 NOTE — Anesthesia Preprocedure Evaluation (Addendum)
Anesthesia Evaluation  ?Patient identified by MRN, date of birth, ID band ?Patient awake ? ? ? ?Reviewed: ?Allergy & Precautions, NPO status , Patient's Chart, lab work & pertinent test results ? ?Airway ?Mallampati: II ? ?TM Distance: >3 FB ? ? ? ? Dental ?  ?Pulmonary ?shortness of breath, former smoker,  ?  ?breath sounds clear to auscultation ? ? ? ? ? ? Cardiovascular ?hypertension, + Peripheral Vascular Disease  ? ?Rhythm:Regular Rate:Normal ? ?Echo 02/07/22 [facility]): ?Summary  ???1. Technically difficult study.  ???2. The left ventricle is normal in size with normal wall thickness.  ???3. The left ventricular systolic function is normal, LVEF is visually  ?estimated at >?55%.  ???4. There is grade II diastolic dysfunction (elevated filling pressure).  ???5. The aortic valve is trileaflet with mildly thickened leaflets with normal  ?excursion.  ???6. The left atrium is mildly dilated in size.  ???7. The right ventricle is normal in size, with normal systolic function.?? ?  ?Neuro/Psych ?  ? GI/Hepatic ?Neg liver ROS,   ?Endo/Other  ?negative endocrine ROS ? Renal/GU ?negative Renal ROS  ? ?  ?Musculoskeletal ? ? Abdominal ?  ?Peds ? Hematology ?  ?Anesthesia Other Findings ? ? Reproductive/Obstetrics ? ?  ? ? ? ? ? ? ? ? ? ? ? ? ? ?  ?  ? ? ? ? ? ? ?Anesthesia Physical ?Anesthesia Plan ? ?ASA: 3 ? ?Anesthesia Plan: General  ? ?Post-op Pain Management:   ? ?Induction: Intravenous ? ?PONV Risk Score and Plan: 3 and Ondansetron and Treatment may vary due to age or medical condition ? ?Airway Management Planned: Oral ETT ? ?Additional Equipment:  ? ?Intra-op Plan:  ? ?Post-operative Plan: Extubation in OR ? ?Informed Consent: I have reviewed the patients History and Physical, chart, labs and discussed the procedure including the risks, benefits and alternatives for the proposed anesthesia with the patient or authorized representative who has indicated his/her understanding and  acceptance.  ? ? ? ?Dental advisory given ? ?Plan Discussed with: CRNA and Anesthesiologist ? ?Anesthesia Plan Comments: (PAT note written 03/26/2022 by Myra Gianotti, PA-C. ?)  ? ? ? ? ?Anesthesia Quick Evaluation ? ?

## 2022-03-26 NOTE — Progress Notes (Signed)
Anesthesia Chart Review: ? Case: [id number] Date/Time: 04/01/22 1215  ? Procedure: LEFT TOTAL HIP ARTHROPLASTY ANTERIOR APPROACH (Left: Hip)  ? Anesthesia type: Spinal  ? Pre-op diagnosis: left hip osteoarthritis  ? Location: [facility] OR ROOM 06 / [facility]  ? Surgeons: [name], MD  ? ?  ? ? ?DISCUSSION: Patient is an 86 year old female scheduled for the above procedure. Surgery was initially scheduled for 01/08/22, but she had been recently treated for bronchitis with persistent cough and wheezing. Postponing surgery for 4 weeks recommended. Also recommended re-evaluation by her PCP Neale Burly, MD given vague cardiac history of 1V CAD from likely > 30 years ago, but no current chest pain symptoms. She had reported cardiology evaluation in [place] ~ 2022 for leg swelling. She thought she might have had a stress test and echo that were normal, but we were unable to confirm which tests were done. Reportedly done through [facility] in [place], [place]. [facility] does have limited record review in [place] which included summaries of primary care encounters at [facility]. There is also a 06/25/21 encounter through [employer] but no note is currently viewable. No cardiac testing noted in [facility]where other than 02/07/22 echo ordered by Dr. Sherrie Sport. ?  ?Since January 2023, she  had re-evaluation by Dr. Sherrie Sport with antihypertensive medication adjustment and echocardiogram done on 02/07/22 showing normal LV systolic function, EF > 35%, grade II diastolic dysfunction, normal RV size and function, no significant valvular disease. He cleared patient for surgery from both a medical and cardiac standpoint. This was reviewed with anesthesiologist Albertha Ghee, MD. ? ?Other history includes former smoker (quit 12/31/87), HTN, dyspnea, skin cancer, venous insufficieny with edema. Patient reported history of what sounds like cardiac cath sometime between 1985-1995 that showed 1V non-obstructive CAD. She reported  cardiology evaluation ~ 2022 in [place] due to "leg swelling" and said she had a stress test and echocardiogram showing no abnormalities. BMI is consistent with obesity. Patient reported chronic, stable BLE edema at her 03/25/22 PAT RN visit. She is on HCTZ combo antihypertensive. She uses CBD gummies as needed for pain.   ? ?Surgeon orders included a preoperative chest x-ray and urinalysis.  Orders placed after patient's PAT visit, so these will need to be done on the day of surgery. Dr. Lorin Mercy is aware.  ? ?Anesthesia team to evaluate on the day of surgery.  ? ?VS: BP (!) 152/81   Pulse 85   Temp 36.6 ?C (Oral)   Resp 18   Ht '5\' 5"'$  (1.651 m)   Wt 103.9 kg   SpO2 93%   BMI 38.11 kg/m?  ? ? ?PROVIDERS: ?Neale Burly, MD is PCP [facility], established ~ 10/2021). Received 01/30/22 office visit. Echo ordered as part of preoperative evaluation.  He ultimately cleared patient for surgery from a medical and cardiac standpoint. ?  ? ?LABS: Labs reviewed: Acceptable for surgery. ?(all labs ordered are listed, but only abnormal results are displayed) ? ?Labs Reviewed  ?BASIC METABOLIC PANEL - Abnormal; Notable for the following components:  ?    Result Value  ? Glucose, Bld 110 (*)   ? All other components within normal limits  ?CBC - Abnormal; Notable for the following components:  ? MCH 24.1 (*)   ? MCHC 29.6 (*)   ? RDW 16.7 (*)   ? All other components within normal limits  ?HEPATIC FUNCTION PANEL - Abnormal; Notable for the following components:  ? Bilirubin, Direct 0.3 (*)   ?  All other components within normal limits  ?SURGICAL PCR SCREEN  ?TYPE AND SCREEN  ? ? ? ?IMAGES: ?CXR ordered by orthopedics. Scheduled for day of surgery, 04/01/22. ? ?Left Hip with pelvis xray [facility] CE): ?Impression: ?- Bilateral hip joint degenerative changes, left greater than right as  ?detailed above.  ?- No acute bony findings.  ?  ?  ?EKG: 01/10/22: ?Normal sinus rhythm ?Low voltage QRS ?Borderline ECG ?No previous  ECGs available ?Confirmed by Kirk Ruths [phone]) on 01/10/2022 3:33:27 PM ?  ? ? ?CV: ?Echo 02/07/22 [facility]): ?Summary  ?  1. Technically difficult study.  ?  2. The left ventricle is normal in size with normal wall thickness.  ?  3. The left ventricular systolic function is normal, LVEF is visually  ?estimated at > 55%.  ?  4. There is grade II diastolic dysfunction (elevated filling pressure).  ?  5. The aortic valve is trileaflet with mildly thickened leaflets with normal  ?excursion.  ?  6. The left atrium is mildly dilated in size.  ?  7. The right ventricle is normal in size, with normal systolic function.   ?  ? Reported seeing a cardiologist in [place] in 2022 for "leg swelling" and had an unremarkable stress and echo. She also reported a cardiac cath between 1985-1995 with 1V non-obstructive CAD. I do not currently have access to these records to review.  ?  ? ?Past Medical History:  ?Diagnosis Date  ? Anemia   ? "i used to have this all the time"  ? Arthritis   ? Cancer [facility])   ? skin cancer- pt has had 2 spots removed from face/ear area (2020 and 2022)  ? Dyspnea   ? Hypertension   ? Peripheral vascular disease ([place])   ? "blockages in my legs"- "valves not good"- causes leg swelling per patient  ? ? ?Past Surgical History:  ?Procedure Laterality Date  ? ABDOMINAL HYSTERECTOMY    ? APPENDECTOMY    ? appendix removed with tubal ligation  ? bladder prolapse    ? BREAST SURGERY    ? "couple of breast biopsies" on left side per patient  ? CARDIAC CATHETERIZATION    ? between 1985-1995. Pt states she had "some blockage in one artery but there was no concern"  ? CATARACT EXTRACTION Bilateral   ? "years ago"  ? CHOLECYSTECTOMY    ? EYE SURGERY Bilateral 1985  ? cataract extraction  ? JOINT REPLACEMENT    ? both knees replaced in 1991  ? KNEE SURGERY Left 1996  ? revision  ? TONSILLECTOMY    ? removed as a child  ? TUBAL LIGATION  1970  ? ? ?MEDICATIONS: ? acetaminophen (TYLENOL) 500 MG tablet  ? amLODipine  (NORVASC) 5 MG tablet  ? betamethasone, augmented, (DIPROLENE) 0.05 % lotion  ? carvedilol (COREG) 12.5 MG tablet  ? Cholecalciferol (VITAMIN D) 125 MCG (5000 UT) CAPS  ? docusate sodium (COLACE) 100 MG capsule  ? fesoterodine (TOVIAZ) 4 MG TB24 tablet  ? fluticasone (FLONASE) 50 MCG/ACT nasal spray  ? guaiFENesin (MUCINEX) 600 MG 12 hr tablet  ? naproxen sodium (ALEVE) 220 MG tablet  ? olmesartan-hydrochlorothiazide (BENICAR HCT) 40-25 MG tablet  ? OVER THE COUNTER MEDICATION  ? vitamin B-12 (CYANOCOBALAMIN) 500 MCG tablet  ? ?No current facility-administered medications for this encounter.  ? ? ?Myra Gianotti, PA-C ?Surgical Short Stay/Anesthesiology ?[facility] Phone [phone] ?[facility] Phone [phone] ?03/26/2022 4:25 PM ? ? ? ? ? ? ? ? ?

## 2022-03-28 ENCOUNTER — Encounter: Payer: Self-pay | Admitting: Surgery

## 2022-03-28 ENCOUNTER — Ambulatory Visit (INDEPENDENT_AMBULATORY_CARE_PROVIDER_SITE_OTHER): Payer: Federal, State, Local not specified - PPO | Admitting: Surgery

## 2022-03-28 VITALS — BP 153/88 | HR 87

## 2022-03-28 DIAGNOSIS — M1612 Unilateral primary osteoarthritis, left hip: Secondary | ICD-10-CM

## 2022-03-28 NOTE — Progress Notes (Signed)
86 year old white female with history of end-stage DJD left hip and pain comes in for preop evaluation.  States that hip symptoms unchanged from previous visit.  She is wanting to proceed with left total hip replacement as scheduled.  Patient was originally scheduled to have surgery January 14, 2022 but was canceled.  We have received preop medical and cardiac clearance from Dr. Sherrie Sport.  Today history and physical performed.  Review of systems negative. ? ?Plan ?We will proceed with surgery as scheduled.  All questions answered and she wishes to proceed. ?

## 2022-03-28 NOTE — H&P (Signed)
TOTAL HIP ADMISSION H&P ? ?Patient is admitted for left total hip arthroplasty. ? ?Subjective: ? ?Chief Complaint: left hip pain ? ?HPI: Meghan Miller, 86 y.o. female, has a history of pain and functional disability in the left hip(s) due to arthritis and patient has failed non-surgical conservative treatments for greater than 12 weeks to include use of assistive devices, weight reduction as appropriate, and activity modification.  Onset of symptoms was gradual starting 10 years ago with gradually worsening course since that time.The patient noted no past surgery on the left hip(s).  Patient currently rates pain in the left hip at 10 out of 10 with activity. Patient has night pain, worsening of pain with activity and weight bearing, trendelenberg gait, pain that interfers with activities of daily living, pain with passive range of motion, crepitus, and joint swelling. Patient has evidence of subchondral cysts, subchondral sclerosis, and periarticular osteophytes by imaging studies. This condition presents safety issues increasing the risk of falls. .  There is no current active infection. ? ?Patient Active Problem List  ? Diagnosis Date Noted  ? Unilateral primary osteoarthritis, left hip 11/29/2021  ? ?Past Medical History:  ?Diagnosis Date  ? Anemia   ? "i used to have this all the time"  ? Arthritis   ? Cancer [facility])   ? skin cancer- pt has had 2 spots removed from face/ear area (2020 and 2022)  ? Dyspnea   ? Hypertension   ? Peripheral vascular disease ([place])   ? "blockages in my legs"- "valves not good"- causes leg swelling per patient  ?  ?Past Surgical History:  ?Procedure Laterality Date  ? ABDOMINAL HYSTERECTOMY    ? APPENDECTOMY    ? appendix removed with tubal ligation  ? bladder prolapse    ? BREAST SURGERY    ? "couple of breast biopsies" on left side per patient  ? CARDIAC CATHETERIZATION    ? between 1985-1995. Pt states she had "some blockage in one artery but there was no concern"  ? CATARACT  EXTRACTION Bilateral   ? "years ago"  ? CHOLECYSTECTOMY    ? EYE SURGERY Bilateral 1985  ? cataract extraction  ? JOINT REPLACEMENT    ? both knees replaced in 1991  ? KNEE SURGERY Left 1996  ? revision  ? TONSILLECTOMY    ? removed as a child  ? TUBAL LIGATION  1970  ?  ?No current facility-administered medications for this encounter.  ? ?Current Outpatient Medications  ?Medication Sig Dispense Refill Last Dose  ? acetaminophen (TYLENOL) 500 MG tablet Take 500 mg by mouth every 6 (six) hours as needed for moderate pain.     ? amLODipine (NORVASC) 5 MG tablet Take 5 mg by mouth daily.     ? betamethasone, augmented, (DIPROLENE) 0.05 % lotion Apply 1 application topically daily as needed for itching.     ? Cholecalciferol (VITAMIN D) 125 MCG (5000 UT) CAPS Take 5,000 Units by mouth daily.     ? docusate sodium (COLACE) 100 MG capsule Take 100 mg by mouth daily as needed for moderate constipation.     ? fesoterodine (TOVIAZ) 4 MG TB24 tablet Take 4 mg by mouth daily.     ? fluticasone (FLONASE) 50 MCG/ACT nasal spray Place 1 spray into both nostrils daily.     ? guaiFENesin (MUCINEX) 600 MG 12 hr tablet Take 600 mg by mouth 2 (two) times daily as needed for cough or to loosen phlegm.     ? naproxen sodium (ALEVE)  220 MG tablet Take 220 mg by mouth in the morning and at bedtime.     ? olmesartan-hydrochlorothiazide (BENICAR HCT) 40-25 MG tablet Take 1 tablet by mouth daily.     ? OVER THE COUNTER MEDICATION Take 2 tablets by mouth daily as needed (pain). CBD Gummies     ? vitamin B-12 (CYANOCOBALAMIN) 500 MCG tablet Take 500 mcg by mouth daily.     ? carvedilol (COREG) 12.5 MG tablet Take 12.5 mg by mouth 2 (two) times daily.     ? ?Allergies  ?Allergen Reactions  ? Penicillins Hives, Shortness Of Breath and Rash  ?  ?Social History  ? ?Tobacco Use  ? Smoking status: Former  ?  Types: Cigarettes  ?  Quit date: 1989  ?  Years since quitting: 34.2  ? Smokeless tobacco: Never  ?Substance Use Topics  ? Alcohol use: Yes  ?   Comment: occasionally-[place] when in [place]  ?  ?No family history on file.  ? ?Review of Systems  ?Constitutional:  Positive for activity change.  ?HENT: Negative.    ?Respiratory: Negative.    ?Cardiovascular: Negative.   ?Gastrointestinal: Negative.   ?Genitourinary: Negative.   ?Musculoskeletal:  Positive for gait problem.  ?Psychiatric/Behavioral: Negative.    ? ?Objective: ? ?Physical Exam ?Constitutional:   ?   Appearance: She is obese.  ?HENT:  ?   Head: Normocephalic and atraumatic.  ?   Nose: Nose normal.  ?Eyes:  ?   Extraocular Movements: Extraocular movements intact.  ?Cardiovascular:  ?   Rate and Rhythm: Regular rhythm.  ?   Heart sounds: Normal heart sounds.  ?Pulmonary:  ?   Effort: Pulmonary effort is normal. No respiratory distress.  ?Abdominal:  ?   General: Bowel sounds are normal.  ?   Tenderness: There is no abdominal tenderness.  ?Neurological:  ?   Mental Status: She is alert and oriented to person, place, and time.  ?Psychiatric:     ?   Mood and Affect: Mood normal.  ? ? ?Vital signs in last 24 hours: ?Pulse Rate:  [87] 87 (03/30 1354) ?BP: (153)/(88) 153/88 (03/30 1354) ? ?Labs: ? ? ?Estimated body mass index is 38.11 kg/m? as calculated from the following: ?  Height as of 03/25/22: '5\' 5"'$  (1.651 m). ?  Weight as of 03/25/22: 103.9 kg. ? ? ?Imaging Review ?Plain radiographs demonstrate moderate degenerative joint disease of the left hip(s). The bone quality appears to be good for age and reported activity level. ? ? ? ? ? ?Assessment/Plan: ? ?End stage arthritis, left hip(s) ? ?The patient history, physical examination, clinical judgement of the provider and imaging studies are consistent with end stage degenerative joint disease of the left hip(s) and total hip arthroplasty is deemed medically necessary. The treatment options including medical management, injection therapy, arthroscopy and arthroplasty were discussed at length. The risks and benefits of total hip arthroplasty were  presented and reviewed. The risks due to aseptic loosening, infection, stiffness, dislocation/subluxation,  thromboembolic complications and other imponderables were discussed.  The patient acknowledged the explanation, agreed to proceed with the plan and consent was signed. Patient is being admitted for inpatient treatment for surgery, pain control, PT, OT, prophylactic antibiotics, VTE prophylaxis, progressive ambulation and ADL's and discharge planning.The patient is planning to be discharged home with home health services ?

## 2022-03-28 NOTE — H&P (View-Only) (Signed)
86 year old white female with history of end-stage DJD left hip and pain comes in for preop evaluation.  States that hip symptoms unchanged from previous visit.  She is wanting to proceed with left total hip replacement as scheduled.  Patient was originally scheduled to have surgery January 14, 2022 but was canceled.  We have received preop medical and cardiac clearance from Dr. Sherrie Sport.  Today history and physical performed.  Review of systems negative. ? ?Plan ?We will proceed with surgery as scheduled.  All questions answered and she wishes to proceed. ?

## 2022-04-01 ENCOUNTER — Encounter (HOSPITAL_COMMUNITY)
Admission: RE | Disposition: A | Discharge status: Home / Self Care | Payer: Self-pay | Source: Home / Self Care | Attending: Orthopaedic Surgery

## 2022-04-01 ENCOUNTER — Inpatient Hospital Stay (HOSPITAL_COMMUNITY)
Admission: RE | Admit: 2022-04-01 | Discharge: 2022-04-08 | DRG: 469 | Disposition: A | Discharge status: Home / Self Care | Payer: Medicare Other | Attending: Orthopaedic Surgery | Admitting: Orthopaedic Surgery

## 2022-04-01 ENCOUNTER — Other Ambulatory Visit: Payer: Self-pay

## 2022-04-01 ENCOUNTER — Encounter (HOSPITAL_COMMUNITY): Payer: Self-pay | Admitting: Orthopaedic Surgery

## 2022-04-01 ENCOUNTER — Inpatient Hospital Stay (HOSPITAL_COMMUNITY): Payer: Medicare Other | Admitting: Certified Registered Nurse Anesthetist

## 2022-04-01 ENCOUNTER — Inpatient Hospital Stay (HOSPITAL_COMMUNITY): Payer: Medicare Other

## 2022-04-01 ENCOUNTER — Observation Stay (HOSPITAL_COMMUNITY): Payer: Medicare Other

## 2022-04-01 ENCOUNTER — Inpatient Hospital Stay (HOSPITAL_COMMUNITY): Payer: Medicare Other | Admitting: Physician Assistant

## 2022-04-01 DIAGNOSIS — Z6838 Body mass index (BMI) 38.0-38.9, adult: Secondary | ICD-10-CM

## 2022-04-01 DIAGNOSIS — Z87891 Personal history of nicotine dependence: Secondary | ICD-10-CM

## 2022-04-01 DIAGNOSIS — M1612 Unilateral primary osteoarthritis, left hip: Secondary | ICD-10-CM | POA: Diagnosis not present

## 2022-04-01 DIAGNOSIS — I10 Essential (primary) hypertension: Secondary | ICD-10-CM

## 2022-04-01 DIAGNOSIS — I9589 Other hypotension: Secondary | ICD-10-CM | POA: Diagnosis not present

## 2022-04-01 DIAGNOSIS — I739 Peripheral vascular disease, unspecified: Secondary | ICD-10-CM | POA: Diagnosis present

## 2022-04-01 DIAGNOSIS — E669 Obesity, unspecified: Secondary | ICD-10-CM | POA: Diagnosis present

## 2022-04-01 DIAGNOSIS — Z79899 Other long term (current) drug therapy: Secondary | ICD-10-CM

## 2022-04-01 DIAGNOSIS — Z90710 Acquired absence of both cervix and uterus: Secondary | ICD-10-CM

## 2022-04-01 DIAGNOSIS — K5900 Constipation, unspecified: Secondary | ICD-10-CM | POA: Diagnosis not present

## 2022-04-01 DIAGNOSIS — Z7189 Other specified counseling: Secondary | ICD-10-CM

## 2022-04-01 DIAGNOSIS — E66812 Obesity, class 2: Secondary | ICD-10-CM

## 2022-04-01 DIAGNOSIS — E8770 Fluid overload, unspecified: Secondary | ICD-10-CM | POA: Diagnosis not present

## 2022-04-01 DIAGNOSIS — R7989 Other specified abnormal findings of blood chemistry: Secondary | ICD-10-CM

## 2022-04-01 DIAGNOSIS — Z85828 Personal history of other malignant neoplasm of skin: Secondary | ICD-10-CM

## 2022-04-01 DIAGNOSIS — D62 Acute posthemorrhagic anemia: Secondary | ICD-10-CM | POA: Diagnosis not present

## 2022-04-01 DIAGNOSIS — Z880 Allergy status to penicillin: Secondary | ICD-10-CM

## 2022-04-01 DIAGNOSIS — J9601 Acute respiratory failure with hypoxia: Secondary | ICD-10-CM

## 2022-04-01 DIAGNOSIS — Z66 Do not resuscitate: Secondary | ICD-10-CM | POA: Diagnosis not present

## 2022-04-01 DIAGNOSIS — Z01818 Encounter for other preprocedural examination: Principal | ICD-10-CM

## 2022-04-01 DIAGNOSIS — D72825 Bandemia: Secondary | ICD-10-CM

## 2022-04-01 HISTORY — PX: TOTAL HIP ARTHROPLASTY: SHX124

## 2022-04-01 LAB — URINALYSIS, ROUTINE W REFLEX MICROSCOPIC
Bilirubin Urine: NEGATIVE
Glucose, UA: NEGATIVE mg/dL
Ketones, ur: NEGATIVE mg/dL
Nitrite: POSITIVE — AB
Protein, ur: NEGATIVE mg/dL
Specific Gravity, Urine: 1.016 (ref 1.005–1.030)
WBC, UA: >50 WBC/hpf — ABNORMAL HIGH (ref 0–5)
pH: 6 (ref 5.0–8.0)

## 2022-04-01 SURGERY — ARTHROPLASTY, HIP, TOTAL, ANTERIOR APPROACH
Anesthesia: General | Site: Hip | Laterality: Left

## 2022-04-01 MED ORDER — SODIUM CHLORIDE 0.9 % IV SOLN: INTRAVENOUS | Status: DC

## 2022-04-01 MED ORDER — FENTANYL CITRATE (PF) 100 MCG/2ML IJ SOLN
INTRAMUSCULAR | Status: AC
  Filled 2022-04-01: qty 2

## 2022-04-01 MED ORDER — DEXAMETHASONE SODIUM PHOSPHATE 10 MG/ML IJ SOLN
INTRAMUSCULAR | Status: DC | PRN
  Administered 2022-04-01: 5 mg via INTRAVENOUS

## 2022-04-01 MED ORDER — TRANEXAMIC ACID-NACL 1000-0.7 MG/100ML-% IV SOLN
INTRAVENOUS | Status: AC
  Filled 2022-04-01: qty 100

## 2022-04-01 MED ORDER — ONDANSETRON HCL 4 MG PO TABS: 4.0000 mg | ORAL_TABLET | Freq: Four times a day (QID) | ORAL | Status: DC | PRN

## 2022-04-01 MED ORDER — ONDANSETRON HCL 4 MG/2ML IJ SOLN
4.0000 mg | Freq: Four times a day (QID) | INTRAMUSCULAR | Status: DC | PRN
  Administered 2022-04-02: 4 mg via INTRAVENOUS
  Filled 2022-04-01: qty 2

## 2022-04-01 MED ORDER — FENTANYL CITRATE (PF) 250 MCG/5ML IJ SOLN
INTRAMUSCULAR | Status: AC
  Filled 2022-04-01: qty 5

## 2022-04-01 MED ORDER — ALBUMIN HUMAN 5 % IV SOLN: INTRAVENOUS | Status: DC | PRN

## 2022-04-01 MED ORDER — ONDANSETRON HCL 4 MG/2ML IJ SOLN
INTRAMUSCULAR | Status: AC
  Filled 2022-04-01: qty 2

## 2022-04-01 MED ORDER — ROCURONIUM BROMIDE 10 MG/ML (PF) SYRINGE
PREFILLED_SYRINGE | INTRAVENOUS | Status: AC
  Filled 2022-04-01: qty 10

## 2022-04-01 MED ORDER — CHLORHEXIDINE GLUCONATE 0.12 % MT SOLN
15.0000 mL | Freq: Once | OROMUCOSAL | Status: AC
  Administered 2022-04-01: 15 mL via OROMUCOSAL
  Filled 2022-04-01: qty 15

## 2022-04-01 MED ORDER — MIDAZOLAM HCL 2 MG/2ML IJ SOLN
INTRAMUSCULAR | Status: DC
Start: 2022-04-01 — End: 2022-04-01
  Filled 2022-04-01: qty 2

## 2022-04-01 MED ORDER — DOCUSATE SODIUM 100 MG PO CAPS
100.0000 mg | ORAL_CAPSULE | Freq: Two times a day (BID) | ORAL | Status: DC
  Administered 2022-04-01 – 2022-04-07 (×13): 100 mg via ORAL
  Filled 2022-04-01 (×14): qty 1

## 2022-04-01 MED ORDER — HYDROMORPHONE HCL 1 MG/ML IJ SOLN: 0.5000 mg | INTRAMUSCULAR | Status: DC | PRN

## 2022-04-01 MED ORDER — METHOCARBAMOL 1000 MG/10ML IJ SOLN
500.0000 mg | Freq: Four times a day (QID) | INTRAVENOUS | Status: DC | PRN
  Filled 2022-04-01: qty 5

## 2022-04-01 MED ORDER — ONDANSETRON HCL 4 MG/2ML IJ SOLN
INTRAMUSCULAR | Status: DC | PRN
Start: 2022-04-01 — End: 2022-04-01
  Administered 2022-04-01: 4 mg via INTRAVENOUS

## 2022-04-01 MED ORDER — ROCURONIUM BROMIDE 10 MG/ML (PF) SYRINGE
PREFILLED_SYRINGE | INTRAVENOUS | Status: DC | PRN
  Administered 2022-04-01: 60 mg via INTRAVENOUS

## 2022-04-01 MED ORDER — ASPIRIN EC 325 MG PO TBEC
325.0000 mg | DELAYED_RELEASE_TABLET | Freq: Every day | ORAL | Status: DC
  Administered 2022-04-02 – 2022-04-08 (×5): 325 mg via ORAL
  Filled 2022-04-01 (×7): qty 1

## 2022-04-01 MED ORDER — PHENYLEPHRINE HCL-NACL 20-0.9 MG/250ML-% IV SOLN
INTRAVENOUS | Status: DC | PRN
  Administered 2022-04-01: 75 ug/min via INTRAVENOUS

## 2022-04-01 MED ORDER — DEXAMETHASONE SODIUM PHOSPHATE 10 MG/ML IJ SOLN
INTRAMUSCULAR | Status: AC
  Filled 2022-04-01: qty 1

## 2022-04-01 MED ORDER — SUCCINYLCHOLINE CHLORIDE 200 MG/10ML IV SOSY
PREFILLED_SYRINGE | INTRAVENOUS | Status: AC
  Filled 2022-04-01: qty 10

## 2022-04-01 MED ORDER — FESOTERODINE FUMARATE ER 4 MG PO TB24
4.0000 mg | ORAL_TABLET | Freq: Every day | ORAL | Status: DC
  Administered 2022-04-02 – 2022-04-08 (×7): 4 mg via ORAL
  Filled 2022-04-01 (×8): qty 1

## 2022-04-01 MED ORDER — LIDOCAINE 2% (20 MG/ML) 5 ML SYRINGE
INTRAMUSCULAR | Status: DC | PRN
Start: 2022-04-01 — End: 2022-04-01
  Administered 2022-04-01: 60 mg via INTRAVENOUS

## 2022-04-01 MED ORDER — FLUTICASONE PROPIONATE 50 MCG/ACT NA SUSP
1.0000 | Freq: Every day | NASAL | Status: DC
  Administered 2022-04-01 – 2022-04-08 (×8): 1 via NASAL
  Filled 2022-04-01: qty 16

## 2022-04-01 MED ORDER — BUPIVACAINE LIPOSOME 1.3 % IJ SUSP
INTRAMUSCULAR | Status: DC | PRN
  Administered 2022-04-01: 10 mL

## 2022-04-01 MED ORDER — PHENOL 1.4 % MT LIQD
1.0000 | OROMUCOSAL | Status: DC | PRN
  Administered 2022-04-01: 1 via OROMUCOSAL
  Filled 2022-04-01 (×2): qty 177

## 2022-04-01 MED ORDER — 0.9 % SODIUM CHLORIDE (POUR BTL) OPTIME
TOPICAL | Status: DC | PRN
  Administered 2022-04-01: 1000 mL

## 2022-04-01 MED ORDER — ORAL CARE MOUTH RINSE: 15.0000 mL | Freq: Once | OROMUCOSAL | Status: DC

## 2022-04-01 MED ORDER — BUPIVACAINE LIPOSOME 1.3 % IJ SUSP
10.0000 mL | Freq: Once | INTRAMUSCULAR | Status: DC
  Filled 2022-04-01: qty 10

## 2022-04-01 MED ORDER — LACTATED RINGERS IV SOLN: INTRAVENOUS | Status: DC

## 2022-04-01 MED ORDER — AMLODIPINE BESYLATE 5 MG PO TABS
5.0000 mg | ORAL_TABLET | Freq: Every day | ORAL | Status: DC
  Administered 2022-04-03: 5 mg via ORAL
  Filled 2022-04-01 (×2): qty 1

## 2022-04-01 MED ORDER — PHENYLEPHRINE 40 MCG/ML (10ML) SYRINGE FOR IV PUSH (FOR BLOOD PRESSURE SUPPORT)
PREFILLED_SYRINGE | INTRAVENOUS | Status: DC | PRN
  Administered 2022-04-01 (×2): 120 ug via INTRAVENOUS
  Administered 2022-04-01: 160 ug via INTRAVENOUS

## 2022-04-01 MED ORDER — BUPIVACAINE LIPOSOME 1.3 % IJ SUSP
INTRAMUSCULAR | Status: AC
  Filled 2022-04-01: qty 20

## 2022-04-01 MED ORDER — SUGAMMADEX SODIUM 200 MG/2ML IV SOLN
INTRAVENOUS | Status: DC | PRN
  Administered 2022-04-01: 200 mg via INTRAVENOUS

## 2022-04-01 MED ORDER — IRBESARTAN 300 MG PO TABS
300.0000 mg | ORAL_TABLET | Freq: Every day | ORAL | Status: DC
  Administered 2022-04-03: 300 mg via ORAL
  Filled 2022-04-01 (×2): qty 1

## 2022-04-01 MED ORDER — ACETAMINOPHEN 325 MG PO TABS
325.0000 mg | ORAL_TABLET | Freq: Four times a day (QID) | ORAL | Status: DC | PRN
  Filled 2022-04-01: qty 2

## 2022-04-01 MED ORDER — CHLORHEXIDINE GLUCONATE 0.12 % MT SOLN: 15.0000 mL | Freq: Once | OROMUCOSAL | Status: DC

## 2022-04-01 MED ORDER — METHOCARBAMOL 500 MG PO TABS
ORAL_TABLET | ORAL | Status: AC
  Filled 2022-04-01: qty 1

## 2022-04-01 MED ORDER — OXYCODONE HCL 5 MG PO TABS
5.0000 mg | ORAL_TABLET | Freq: Four times a day (QID) | ORAL | Status: DC | PRN
  Administered 2022-04-01 – 2022-04-02 (×3): 10 mg via ORAL
  Filled 2022-04-01 (×3): qty 2

## 2022-04-01 MED ORDER — BUPIVACAINE HCL (PF) 0.5 % IJ SOLN
INTRAMUSCULAR | Status: AC
  Filled 2022-04-01: qty 10

## 2022-04-01 MED ORDER — MENTHOL 3 MG MT LOZG
1.0000 | LOZENGE | OROMUCOSAL | Status: DC | PRN
  Filled 2022-04-01 (×2): qty 9

## 2022-04-01 MED ORDER — BUPIVACAINE HCL 0.5 % IJ SOLN
INTRAMUSCULAR | Status: DC | PRN
  Administered 2022-04-01: 10 mL

## 2022-04-01 MED ORDER — CARVEDILOL 12.5 MG PO TABS
12.5000 mg | ORAL_TABLET | Freq: Two times a day (BID) | ORAL | Status: DC
  Administered 2022-04-01 – 2022-04-03 (×3): 12.5 mg via ORAL
  Filled 2022-04-01 (×4): qty 1

## 2022-04-01 MED ORDER — FENTANYL CITRATE (PF) 250 MCG/5ML IJ SOLN
INTRAMUSCULAR | Status: DC | PRN
Start: 2022-04-01 — End: 2022-04-01
  Administered 2022-04-01 (×3): 50 ug via INTRAVENOUS
  Administered 2022-04-01: 100 ug via INTRAVENOUS
  Administered 2022-04-01: 50 ug via INTRAVENOUS

## 2022-04-01 MED ORDER — PHENYLEPHRINE 40 MCG/ML (10ML) SYRINGE FOR IV PUSH (FOR BLOOD PRESSURE SUPPORT)
PREFILLED_SYRINGE | INTRAVENOUS | Status: AC
  Filled 2022-04-01: qty 10

## 2022-04-01 MED ORDER — METOCLOPRAMIDE HCL 5 MG/ML IJ SOLN
5.0000 mg | Freq: Three times a day (TID) | INTRAMUSCULAR | Status: DC | PRN
  Administered 2022-04-02: 10 mg via INTRAVENOUS
  Filled 2022-04-01: qty 2

## 2022-04-01 MED ORDER — FENTANYL CITRATE (PF) 100 MCG/2ML IJ SOLN
25.0000 ug | INTRAMUSCULAR | Status: DC | PRN
  Administered 2022-04-01 (×3): 50 ug via INTRAVENOUS

## 2022-04-01 MED ORDER — METOCLOPRAMIDE HCL 5 MG PO TABS: 5.0000 mg | ORAL_TABLET | Freq: Three times a day (TID) | ORAL | Status: DC | PRN

## 2022-04-01 MED ORDER — SUCCINYLCHOLINE CHLORIDE 200 MG/10ML IV SOSY
PREFILLED_SYRINGE | INTRAVENOUS | Status: DC | PRN
  Administered 2022-04-01: 120 mg via INTRAVENOUS

## 2022-04-01 MED ORDER — METHOCARBAMOL 500 MG PO TABS
500.0000 mg | ORAL_TABLET | Freq: Four times a day (QID) | ORAL | Status: DC | PRN
  Administered 2022-04-01 – 2022-04-02 (×2): 500 mg via ORAL
  Filled 2022-04-01: qty 1

## 2022-04-01 MED ORDER — HYDROCHLOROTHIAZIDE 25 MG PO TABS
25.0000 mg | ORAL_TABLET | Freq: Every day | ORAL | Status: DC
  Administered 2022-04-03 – 2022-04-04 (×2): 25 mg via ORAL
  Filled 2022-04-01 (×3): qty 1

## 2022-04-01 MED ORDER — OLMESARTAN MEDOXOMIL-HCTZ 40-25 MG PO TABS: 1.0000 | ORAL_TABLET | Freq: Every day | ORAL | Status: DC

## 2022-04-01 MED ORDER — LIDOCAINE 2% (20 MG/ML) 5 ML SYRINGE
INTRAMUSCULAR | Status: AC
  Filled 2022-04-01: qty 5

## 2022-04-01 MED ORDER — TRANEXAMIC ACID-NACL 1000-0.7 MG/100ML-% IV SOLN
INTRAVENOUS | Status: DC | PRN
Start: 2022-04-01 — End: 2022-04-01
  Administered 2022-04-01: 1000 mg via INTRAVENOUS

## 2022-04-01 MED ORDER — PROPOFOL 10 MG/ML IV BOLUS
INTRAVENOUS | Status: DC | PRN
  Administered 2022-04-01: 130 mg via INTRAVENOUS

## 2022-04-01 MED ORDER — ACETAMINOPHEN 10 MG/ML IV SOLN
INTRAVENOUS | Status: DC | PRN
Start: 2022-04-01 — End: 2022-04-01
  Administered 2022-04-01: 1000 mg via INTRAVENOUS

## 2022-04-01 MED ORDER — VANCOMYCIN HCL IN DEXTROSE 1-5 GM/200ML-% IV SOLN
1000.0000 mg | INTRAVENOUS | Status: AC
  Administered 2022-04-01: 1000 mg via INTRAVENOUS
  Filled 2022-04-01: qty 200

## 2022-04-01 MED ORDER — ORAL CARE MOUTH RINSE: 15.0000 mL | Freq: Once | OROMUCOSAL | Status: AC

## 2022-04-01 SURGICAL SUPPLY — 55 items
BAG COUNTER SPONGE SURGICOUNT (BAG) ×2 IMPLANT
BENZOIN TINCTURE PRP APPL 2/3 (GAUZE/BANDAGES/DRESSINGS) ×2 IMPLANT
BLADE CLIPPER SURG (BLADE) IMPLANT
BLADE SAW SGTL 18X1.27X75 (BLADE) ×2 IMPLANT
COVER PERINEAL POST (MISCELLANEOUS) ×2 IMPLANT
COVER SURGICAL LIGHT HANDLE (MISCELLANEOUS) ×2 IMPLANT
DRAPE C-ARM 42X72 X-RAY (DRAPES) ×2 IMPLANT
DRAPE IMP U-DRAPE 54X76 (DRAPES) ×2 IMPLANT
DRAPE STERI IOBAN 125X83 (DRAPES) ×2 IMPLANT
DRAPE U-SHAPE 47X51 STRL (DRAPES) ×6 IMPLANT
DRSG MEPILEX BORDER 4X12 (GAUZE/BANDAGES/DRESSINGS) ×1 IMPLANT
DRSG MEPILEX BORDER 4X8 (GAUZE/BANDAGES/DRESSINGS) ×2 IMPLANT
DURAPREP 26ML APPLICATOR (WOUND CARE) ×2 IMPLANT
ELECT BLADE 4.0 EZ CLEAN MEGAD (MISCELLANEOUS)
ELECT CAUTERY BLADE 6.4 (BLADE) ×2 IMPLANT
ELECT REM PT RETURN 9FT ADLT (ELECTROSURGICAL) ×2
ELECTRODE BLDE 4.0 EZ CLN MEGD (MISCELLANEOUS) IMPLANT
ELECTRODE REM PT RTRN 9FT ADLT (ELECTROSURGICAL) ×1 IMPLANT
ELIMINATOR HOLE APEX DEPUY (Hips) ×1 IMPLANT
FACESHIELD WRAPAROUND (MASK) ×4 IMPLANT
FACESHIELD WRAPAROUND OR TEAM (MASK) ×2 IMPLANT
GLOVE SRG 8 PF TXTR STRL LF DI (GLOVE) ×2 IMPLANT
GLOVE SURG ORTHO LTX SZ7.5 (GLOVE) ×4 IMPLANT
GLOVE SURG UNDER POLY LF SZ8 (GLOVE) ×2
GOWN STRL REUS W/ TWL LRG LVL3 (GOWN DISPOSABLE) ×1 IMPLANT
GOWN STRL REUS W/ TWL XL LVL3 (GOWN DISPOSABLE) ×1 IMPLANT
GOWN STRL REUS W/TWL 2XL LVL3 (GOWN DISPOSABLE) ×2 IMPLANT
GOWN STRL REUS W/TWL LRG LVL3 (GOWN DISPOSABLE) ×1
GOWN STRL REUS W/TWL XL LVL3 (GOWN DISPOSABLE) ×1
HEAD M SROM 36MM PLUS 1.5 (Hips) IMPLANT
KIT BASIN OR (CUSTOM PROCEDURE TRAY) ×2 IMPLANT
KIT TURNOVER KIT B (KITS) ×2 IMPLANT
LINER NEUTRAL 52X36MM PLUS 4 (Liner) ×1 IMPLANT
MANIFOLD NEPTUNE II (INSTRUMENTS) ×2 IMPLANT
NDL HYPO 21X1 ECLIPSE (NEEDLE) ×1 IMPLANT
NEEDLE HYPO 21X1 ECLIPSE (NEEDLE) ×2 IMPLANT
NS IRRIG 1000ML POUR BTL (IV SOLUTION) ×2 IMPLANT
PACK TOTAL JOINT (CUSTOM PROCEDURE TRAY) ×2 IMPLANT
PAD ARMBOARD 7.5X6 YLW CONV (MISCELLANEOUS) ×4 IMPLANT
PIN SECTOR W/GRIP ACE CUP 52MM (Hips) ×1 IMPLANT
SROM M HEAD 36MM PLUS 1.5 (Hips) ×2 IMPLANT
STEM FEMORAL SZ 5MM STD ACTIS (Stem) ×1 IMPLANT
STRIP CLOSURE SKIN 1/2X4 (GAUZE/BANDAGES/DRESSINGS) ×2 IMPLANT
SUT VIC AB 0 CT1 27 (SUTURE) ×1
SUT VIC AB 0 CT1 27XBRD ANBCTR (SUTURE) ×1 IMPLANT
SUT VIC AB 2-0 CT1 27 (SUTURE) ×1
SUT VIC AB 2-0 CT1 TAPERPNT 27 (SUTURE) ×1 IMPLANT
SUT VICRYL 4-0 PS2 18IN ABS (SUTURE) ×2 IMPLANT
SUT VLOC 180 0 24IN GS25 (SUTURE) ×2 IMPLANT
SYR 20CC LL (SYRINGE) ×2 IMPLANT
TOWEL GREEN STERILE (TOWEL DISPOSABLE) ×4 IMPLANT
TOWEL GREEN STERILE FF (TOWEL DISPOSABLE) ×2 IMPLANT
TRAY CATH 16FR W/PLASTIC CATH (SET/KITS/TRAYS/PACK) IMPLANT
TRAY FOLEY MTR SLVR 16FR STAT (SET/KITS/TRAYS/PACK) ×1 IMPLANT
WATER STERILE IRR 1000ML POUR (IV SOLUTION) ×4 IMPLANT

## 2022-04-01 NOTE — Transfer of Care (Signed)
Immediate Anesthesia Transfer of Care Note ? ?Patient: Meghan Miller ? ?Procedure(s) Performed: LEFT TOTAL HIP ARTHROPLASTY ANTERIOR APPROACH (Left: Hip) ? ?Patient Location: PACU ? ?Anesthesia Type:General ? ?Level of Consciousness: awake, alert  and oriented ? ?Airway & Oxygen Therapy: Patient Spontanous Breathing and Patient connected to face mask oxygen ? ?Post-op Assessment: Report given to RN and Post -op Vital signs reviewed and stable ? ?Post vital signs: Reviewed and stable ? ?Last Vitals:  ?Vitals Value Taken Time  ?BP 149/68 04/01/22 1517  ?Temp    ?Pulse 83 04/01/22 1520  ?Resp 14 04/01/22 1520  ?SpO2 95 % 04/01/22 1520  ?Vitals shown include unvalidated device data. ? ?Last Pain:  ?Vitals:  ? 04/01/22 1152  ?TempSrc:   ?PainSc: 0-No pain  ?   ? ?  ? ?Complications: No notable events documented. ?

## 2022-04-01 NOTE — Plan of Care (Signed)

## 2022-04-01 NOTE — Care Management Obs Status (Signed)
MEDICARE OBSERVATION STATUS NOTIFICATION ? ? ?Patient Details  ?Name: Meghan Miller ?MRN: [id number] ?Date of Birth: 11-08-1936 ? ? ?Medicare Observation Status Notification Given:  Yes ? ? ? ?Bartholomew Crews, RN ?04/01/2022, 4:56 PM ?

## 2022-04-01 NOTE — Care Management CC44 (Signed)
Condition Code 44 Documentation Completed ? ?Patient Details  ?Name: Meghan Miller ?MRN: [id number] ?Date of Birth: 22-May-1936 ? ? ?Condition Code 44 given:  Yes ?Patient signature on Condition Code 44 notice:  Yes ?Documentation of 2 MD's agreement:  Yes ?Code 44 added to claim:  Yes ? ? ? ?Bartholomew Crews, RN ?04/01/2022, 4:56 PM ? ?

## 2022-04-01 NOTE — Interval H&P Note (Signed)
History and Physical Interval Note: ? ?04/01/2022 ?1:12 PM ? ?Meghan Miller  has presented today for surgery, with the diagnosis of left hip osteoarthritis.  The various methods of treatment have been discussed with the patient and family. After consideration of risks, benefits and other options for treatment, the patient has consented to  Procedure(s): ?LEFT TOTAL HIP ARTHROPLASTY ANTERIOR APPROACH (Left) as a surgical intervention.  The patient's history has been reviewed, patient examined, no change in status, stable for surgery.  I have reviewed the patient's chart and labs.  Questions were answered to the patient's satisfaction.   ? ? ?[name] ? ? ?

## 2022-04-01 NOTE — Interval H&P Note (Signed)
History and Physical Interval Note: ? ?04/01/2022 ?1:12 PM ? ?[name]  has presented today for surgery, with the diagnosis of left hip osteoarthritis.  The various methods of treatment have been discussed with the patient and family. After consideration of risks, benefits and other options for treatment, the patient has consented to  Procedure(s): ?LEFT TOTAL HIP ARTHROPLASTY ANTERIOR APPROACH (Left) as a surgical intervention.  The patient's history has been reviewed, patient examined, no change in status, stable for surgery.  I have reviewed the patient's chart and labs.  Questions were answered to the patient's satisfaction.   ? ? ?Meghan Miller ? ? ?

## 2022-04-01 NOTE — Op Note (Signed)
Preop diagnosis: Left hip primary osteoarthritis ? ?Postop diagnosis same ? ?Procedure: Right total of arthroplasty. ? ?Surgeon: Lorin Mercy MD ? ?Assistant: Benjiman Core, PA-C medically necessary and present for the entire procedure. ? ?Anesthesia: General plus Marcaine and Exparel. ? ?EBL 800 cc. ? ?Implants:Implants ? ?ELIMINATOR HOLE APEX DEPUY - [id number] ? ?Inventory Item: ELIMINATOR HOLE APEX DEPUY Serial no.:  Model/Cat no.: [id number]  ?Implant nameBrock Bad APEX DEPUY - [id number] Laterality: Left Area: Hip  ?Manufacturer: Dewitt Hoes Date of Manufacture:    ?Action: Implanted Number Used: 1   ?Device Identifier:  Device Identifier Type:    ? ?PIN SECTOR W/GRIP ACE CUP 52MM - [id number] ? ?Inventory Item: PIN SECTOR W/GRIP ACE CUP 52MM Serial no.:  Model/Cat no.: [id number]  ?Implant name: PIN SECTOR W/GRIP ACE CUP 52MM - [id number] Laterality: Left Area: Hip  ?Manufacturer: Dewitt Hoes Date of Manufacture:    ?Action: Implanted Number Used: 1   ?Device Identifier:  Device Identifier Type:    ? ?LINER NEUTRAL 52X36MM PLUS 4 - [id number] ? ?Inventory Item: LINER NEUTRAL 52X36MM PLUS 4 Serial no.:  Model/Cat no.: [id number]  ?Implant name: LINER NEUTRAL 52X36MM PLUS 4 - [id number] Laterality: Left Area: Hip  ?Manufacturer: Dewitt Hoes Date of Manufacture:    ?Action: Implanted Number Used: 1   ?Device Identifier:  Device Identifier Type:    ? ?STEM FEMORAL SZ 5MM STD ACTIS - [id number] ? ?Inventory Item: STEM FEMORAL SZ 5MM STD ACTIS Serial no.:  Model/Cat no.: [id number]  ?Implant name: STEM FEMORAL SZ 5MM STD ACTIS - [id number] Laterality: Left Area: Hip  ?Manufacturer: Dewitt Hoes Date of Manufacture:    ?Action: Implanted Number Used: 1   ?Device Identifier:  Device Identifier Type:    ? ?SROM M HEAD 36MM PLUS 1.5 - [id number] ? ?Inventory Item: SROM M HEAD 36MM PLUS 1.5 Serial no.:  Model/Cat no.: [id number]  ?Implant name: SROM M HEAD 36MM PLUS 1.5 - [id number] Laterality: Left Area: Hip   ?Manufacturer: Dewitt Hoes Date of Manufacture:    ?Action: Implanted Number Used: 1   ?Device Identifier:  Device Identifier Type:    ?Procedure: Induction general anesthesia Foley catheter placement Hana boots patient placed on the Hana table careful padding positioning C-arm was brought in for visualization.  Prepping with DuraPrep after 1015 drapes were applied and abdomen was taped over.  Sterile skin marker blue sticky U drapes were applied large shower curtain Betadine Steri-Drape and half sheet across and a half on the other side.  Timeout procedure was completed IV TXA was given.  Patient received IV vancomycin due to allergies to penicillin. ? ?After timeout procedure incision was made 1 fingerbreadth lateral 1 inferior to the ASIS obliquely to the trochanter.  Subtenons tissue was divided down to the fascia fascia was nicked in line with the skin incision extended and then dull retractor placed over the top of the anterior capsule.  Hibbs retractor laterally transverse bleeders were coagulated fat was removed anterior capsule was opened and the dull cobra was placed inside the capsule.  Neck was cut 1 fingerbreadth above the lesser troches with the C arm confirmation.  Head was removed with a corkscrew.  Sequential reaming up to 51 for 52 cup.  Dome screws were available but not needed since there was a tight fit in the apex illuminated was placed tightened and a +4 no lip liner was inserted which was polyethylene.  Hydraulic Lacinda Axon was applied external rotation 110 leg was taken down and under and then sequential  preparation of the femur after using the large trochanteric retractor and Mueller retractor medially on the neck.  Some Side to be divided posteriorly.  Patient's pressure was up some during the case and there was bleeding continue to come out of the femoral canal.  We packed it twice continue to use AP lateral x-ray showed the femur was intact and we gradually progressed up to #5 with  tight fit good stability to equal leg lengths the permanent #5 stem was inserted.  Stability was checked external rotation 90 degrees halfway down to the floor there was no shock and leg lengths were equal.  Metal ball was selected inserted stability findings were identical leg lengths were equal.  Transverse bleeders were coagulated 1 last time operative field was dry.  Some oozing around the stem from the femoral canal had stopped.  V-Loc closure was used in the fascia subtendinous tissue reapproximated skin staple closure postop dressing and transferred to care room. ?

## 2022-04-01 NOTE — Anesthesia Postprocedure Evaluation (Signed)
Anesthesia Post Note ? ?Patient: Meghan Miller ? ?Procedure(s) Performed: LEFT TOTAL HIP ARTHROPLASTY ANTERIOR APPROACH (Left: Hip) ? ?  ? ?Patient location during evaluation: PACU ?Anesthesia Type: General ?Level of consciousness: awake and alert ?Pain management: pain level controlled ?Vital Signs Assessment: post-procedure vital signs reviewed and stable ?Respiratory status: spontaneous breathing, nonlabored ventilation, respiratory function stable and patient connected to nasal cannula oxygen ?Cardiovascular status: blood pressure returned to baseline and stable ?Postop Assessment: no apparent nausea or vomiting ?Anesthetic complications: no ? ? ?No notable events documented. ? ?Last Vitals:  ?Vitals:  ? 04/01/22 1515 04/01/22 1530  ?BP: (!) 149/68 137/65  ?Pulse: 82 80  ?Resp: 16 16  ?Temp: (!) 36.2 ?C   ?SpO2: 96% 100%  ?  ?Last Pain:  ?Vitals:  ? 04/01/22 1515  ?TempSrc:   ?PainSc: 10-Worst pain ever  ? ? ?  ?  ?  ?  ?  ?  ? ?, S ? ? ? ? ?

## 2022-04-01 NOTE — Anesthesia Procedure Notes (Signed)
Procedure Name: Intubation ?Date/Time: 04/01/2022 1:23 PM ?Performed by: Carolan Clines, CRNA ?Pre-anesthesia Checklist: Patient identified, Emergency Drugs available, Suction available and Patient being monitored ?Patient Re-evaluated:Patient Re-evaluated prior to induction ?Oxygen Delivery Method: Circle System Utilized ?Preoxygenation: Pre-oxygenation with 100% oxygen ?Induction Type: IV induction ?Laryngoscope Size: Glidescope and 3 ?Grade View: Grade I ?Tube type: Oral ?Tube size: 7.0 mm ?Number of attempts: 1 ?Airway Equipment and Method: Rigid stylet and Video-laryngoscopy ?Placement Confirmation: ETT inserted through vocal cords under direct vision, positive ETCO2 and breath sounds checked- equal and bilateral ?Secured at: 22 cm ?Tube secured with: Tape ?Dental Injury: Teeth and Oropharynx as per pre-operative assessment  ?Comments: Elective glidescope intubation. ? ? ? ? ?

## 2022-04-02 ENCOUNTER — Encounter (HOSPITAL_COMMUNITY): Payer: Self-pay | Admitting: Orthopaedic Surgery

## 2022-04-02 ENCOUNTER — Telehealth: Payer: Self-pay

## 2022-04-02 DIAGNOSIS — M1612 Unilateral primary osteoarthritis, left hip: Secondary | ICD-10-CM | POA: Diagnosis not present

## 2022-04-02 DIAGNOSIS — I10 Essential (primary) hypertension: Secondary | ICD-10-CM | POA: Diagnosis not present

## 2022-04-02 DIAGNOSIS — Z880 Allergy status to penicillin: Secondary | ICD-10-CM | POA: Diagnosis not present

## 2022-04-02 DIAGNOSIS — M79604 Pain in right leg: Secondary | ICD-10-CM | POA: Diagnosis not present

## 2022-04-02 DIAGNOSIS — I739 Peripheral vascular disease, unspecified: Secondary | ICD-10-CM | POA: Diagnosis not present

## 2022-04-02 DIAGNOSIS — Z90710 Acquired absence of both cervix and uterus: Secondary | ICD-10-CM | POA: Diagnosis not present

## 2022-04-02 DIAGNOSIS — Z79899 Other long term (current) drug therapy: Secondary | ICD-10-CM | POA: Diagnosis not present

## 2022-04-02 DIAGNOSIS — J9601 Acute respiratory failure with hypoxia: Secondary | ICD-10-CM | POA: Diagnosis not present

## 2022-04-02 DIAGNOSIS — Z85828 Personal history of other malignant neoplasm of skin: Secondary | ICD-10-CM | POA: Diagnosis not present

## 2022-04-02 DIAGNOSIS — M7989 Other specified soft tissue disorders: Secondary | ICD-10-CM | POA: Diagnosis not present

## 2022-04-02 DIAGNOSIS — K5900 Constipation, unspecified: Secondary | ICD-10-CM | POA: Diagnosis not present

## 2022-04-02 DIAGNOSIS — Z7189 Other specified counseling: Secondary | ICD-10-CM | POA: Diagnosis not present

## 2022-04-02 DIAGNOSIS — E669 Obesity, unspecified: Secondary | ICD-10-CM | POA: Diagnosis not present

## 2022-04-02 DIAGNOSIS — Z6838 Body mass index (BMI) 38.0-38.9, adult: Secondary | ICD-10-CM | POA: Diagnosis not present

## 2022-04-02 DIAGNOSIS — M79605 Pain in left leg: Secondary | ICD-10-CM | POA: Diagnosis not present

## 2022-04-02 DIAGNOSIS — Z87891 Personal history of nicotine dependence: Secondary | ICD-10-CM | POA: Diagnosis not present

## 2022-04-02 DIAGNOSIS — R7989 Other specified abnormal findings of blood chemistry: Secondary | ICD-10-CM | POA: Diagnosis not present

## 2022-04-02 DIAGNOSIS — I9589 Other hypotension: Secondary | ICD-10-CM | POA: Diagnosis not present

## 2022-04-02 DIAGNOSIS — Z66 Do not resuscitate: Secondary | ICD-10-CM | POA: Diagnosis not present

## 2022-04-02 DIAGNOSIS — E8770 Fluid overload, unspecified: Secondary | ICD-10-CM | POA: Diagnosis not present

## 2022-04-02 DIAGNOSIS — D62 Acute posthemorrhagic anemia: Secondary | ICD-10-CM | POA: Diagnosis not present

## 2022-04-02 DIAGNOSIS — D72825 Bandemia: Secondary | ICD-10-CM | POA: Diagnosis not present

## 2022-04-02 LAB — CBC
HCT: 29.3 % — ABNORMAL LOW (ref 36.0–46.0)
Hemoglobin: 9.1 g/dL — ABNORMAL LOW (ref 12.0–15.0)
MCH: 24.7 pg — ABNORMAL LOW (ref 26.0–34.0)
MCHC: 31.1 g/dL (ref 30.0–36.0)
MCV: 79.4 fL — ABNORMAL LOW (ref 80.0–100.0)
Platelets: 229 10*3/uL (ref 150–400)
RBC: 3.69 MIL/uL — ABNORMAL LOW (ref 3.87–5.11)
RDW: 16.7 % — ABNORMAL HIGH (ref 11.5–15.5)
WBC: 14 10*3/uL — ABNORMAL HIGH (ref 4.0–10.5)
nRBC: 0 % (ref 0.0–0.2)

## 2022-04-02 LAB — BASIC METABOLIC PANEL
Anion gap: 6 (ref 5–15)
BUN: 14 mg/dL (ref 8–23)
CO2: 30 mmol/L (ref 22–32)
Calcium: 9.1 mg/dL (ref 8.9–10.3)
Chloride: 102 mmol/L (ref 98–111)
Creatinine, Ser: 0.75 mg/dL (ref 0.44–1.00)
GFR, Estimated: >60 mL/min (ref >60)
Glucose, Bld: 138 mg/dL — ABNORMAL HIGH (ref 70–99)
Potassium: 4 mmol/L (ref 3.5–5.1)
Sodium: 138 mmol/L (ref 135–145)

## 2022-04-02 MED ORDER — METHOCARBAMOL 500 MG PO TABS: 500.0000 mg | ORAL_TABLET | Freq: Four times a day (QID) | ORAL | 0 refills | Status: DC | PRN

## 2022-04-02 MED ORDER — ASPIRIN 325 MG PO TBEC: 325.0000 mg | DELAYED_RELEASE_TABLET | Freq: Every day | ORAL | 0 refills | Status: DC

## 2022-04-02 MED ORDER — HYDROCODONE-ACETAMINOPHEN 7.5-325 MG PO TABS
1.0000 | ORAL_TABLET | Freq: Four times a day (QID) | ORAL | Status: DC | PRN
  Administered 2022-04-02 – 2022-04-03 (×4): 1 via ORAL
  Filled 2022-04-02 (×4): qty 1

## 2022-04-02 MED ORDER — HYDROCODONE-ACETAMINOPHEN 7.5-325 MG PO TABS: 1.0000 | ORAL_TABLET | Freq: Four times a day (QID) | ORAL | 0 refills | Status: DC | PRN

## 2022-04-02 MED ORDER — SODIUM CHLORIDE 0.9 % IV SOLN: INTRAVENOUS | Status: DC

## 2022-04-02 NOTE — Progress Notes (Signed)
Pt has not had anymore urination, educated pt on in and out cath with MD order, pt states that she will drink more and try to urinate by self, states this is baseline for her, she had small freq incontinence episodes, refuses in and out cath at this time, we will assess again in 2 hrs, more drinks offered. ?

## 2022-04-02 NOTE — Progress Notes (Signed)
Pt has one incontinent episode post foley removal, encouraged pt to try to urinate more, drinks offered as tolerated. ?

## 2022-04-02 NOTE — Plan of Care (Signed)
  Problem: Pain Managment: Goal: General experience of comfort will improve Outcome: Progressing   

## 2022-04-02 NOTE — Progress Notes (Signed)
Patient ID: Meghan Miller, female   DOB: 1936/04/09, 86 y.o.   MRN: [id number] ?Patient walked in room with physical therapy has not been out in the hall has not done stairs.  Problems with throwing up with mobility is better with Reglan no problems in last 3 hours.  Likely related to general anesthesia and possibly plus the pain medication.  Patient's son has a cardiology appointment today.  Plan is patient going home and son will be staying with her.  Hopefully therapy will work with her this afternoon. ?

## 2022-04-02 NOTE — Progress Notes (Signed)
MD notified of bladder scan results, in and out cath performed, MD notified of the output, will offer pt more fluids. ?

## 2022-04-02 NOTE — Evaluation (Signed)
Occupational Therapy Evaluation ?Patient Details ?Name: Meghan Miller ?MRN: [id number] ?DOB: 21-Aug-1936 ?Today's Date: 04/02/2022 ? ? ?History of Present Illness Pt is a 86 y.o. F who presents s/p L THA, direct anterior 04/01/2022. Significant PMH: skin CA, PVD, bilateral TKA's.  ? ?Clinical Impression ?  ?Pt lives alone, is independent with ADLs at baseline, uses cane for in-home and rollator for community mobility. Pt's son will be staying with her the first week after d/c, possibly may be able to stay longer but she is unsure. Pt currently min guard-max A for ADLs, mod A for bed mobility and min guard-min A for transfers. Pt limited by pain with mobility, denies nausea but reports being cold and having chills. RN notified. Pt orthostatic with sit to stand transfer, and short distance ambulation in room. Pt presenting with impairments listed below, will follow acutely. Recommend HHOT at d/c. ? ?BP sitting 109/53 (70) ?BP standing 84/36 (52) ?BP supine after transfer 98/51 (65) ?   ? ?Recommendations for follow up therapy are one component of a multi-disciplinary discharge planning process, led by the attending physician.  Recommendations may be updated based on patient status, additional functional criteria and insurance authorization.  ? ?Follow Up Recommendations ? Home health OT  ?  ?Assistance Recommended at Discharge Intermittent Supervision/Assistance  ?Patient can return home with the following A lot of help with walking and/or transfers;A lot of help with bathing/dressing/bathroom;Assistance with cooking/housework;Help with stairs or ramp for entrance;Assist for transportation ? ?  ?Functional Status Assessment ? Patient has had a recent decline in their functional status and demonstrates the ability to make significant improvements in function in a reasonable and predictable amount of time.  ?Equipment Recommendations ? BSC/3in1  ?  ?Recommendations for Other Services PT consult ? ? ?  ?Precautions /  Restrictions Precautions ?Precautions: Fall;Other (comment) ?Precaution Comments: watch BP ?Restrictions ?Weight Bearing Restrictions: Yes ?LLE Weight Bearing: Weight bearing as tolerated  ? ?  ? ?Mobility Bed Mobility ?Overal bed mobility: Needs Assistance ?Bed Mobility: Supine to Sit, Sit to Supine ?  ?  ?Supine to sit: Mod assist ?Sit to supine: Mod assist ?  ?General bed mobility comments: to assist BLE into/out of bed ?  ? ?Transfers ?Overall transfer level: Needs assistance ?Equipment used: Rolling walker (2 wheels) ?Transfers: Sit to/from Stand ?Sit to Stand: Min assist ?  ?  ?Step pivot transfers: Min guard ?  ?  ?  ?  ? ?  ?Balance Overall balance assessment: Needs assistance ?Sitting-balance support: Feet supported ?Sitting balance-Leahy Scale: Fair ?  ?  ?Standing balance support: Bilateral upper extremity supported, Reliant on assistive device for balance ?Standing balance-Leahy Scale: Poor ?Standing balance comment: reliant on RW support ?  ?  ?  ?  ?  ?  ?  ?  ?  ?  ?  ?   ? ?ADL either performed or assessed with clinical judgement  ? ?ADL Overall ADL's : Needs assistance/impaired ?Eating/Feeding: Set up;Sitting ?  ?Grooming: Set up;Sitting ?  ?Upper Body Bathing: Minimal assistance;Sitting ?  ?Lower Body Bathing: Maximal assistance;Sitting/lateral leans ?  ?Upper Body Dressing : Minimal assistance;Sitting ?  ?Lower Body Dressing: Maximal assistance;Sitting/lateral leans ?  ?Toilet Transfer: Min guard;Minimal assistance;Rolling walker (2 wheels);Stand-pivot;BSC/3in1 ?  ?Toileting- Clothing Manipulation and Hygiene: Maximal assistance ?  ?  ?  ?Functional mobility during ADLs: Minimal assistance;Min guard;Rolling walker (2 wheels) ?   ? ? ? ?Vision   ?Vision Assessment?: No apparent visual deficits  ?   ?Perception   ?  ?  Praxis   ?  ? ?Pertinent Vitals/Pain Pain Assessment ?Pain Assessment: Faces ?Pain Score: 6  ?Faces Pain Scale: Hurts even more ?Pain Location: L hip ?Pain Descriptors / Indicators:  Grimacing, Operative site guarding ?Pain Intervention(s): Limited activity within patient's tolerance, Monitored during session, Repositioned, Patient requesting pain meds-RN notified  ? ? ? ?Hand Dominance   ?  ?Extremity/Trunk Assessment Upper Extremity Assessment ?Upper Extremity Assessment: Generalized weakness ?  ?Lower Extremity Assessment ?Lower Extremity Assessment: Defer to PT evaluation ?  ?Cervical / Trunk Assessment ?Cervical / Trunk Assessment: Normal ?  ?Communication Communication ?Communication: No difficulties ?  ?Cognition Arousal/Alertness: Awake/alert ?Behavior During Therapy: [facility] for tasks assessed/performed ?Overall Cognitive Status: Within Functional Limits for tasks assessed ?  ?  ?  ?  ?  ?  ?  ?  ?  ?  ?  ?  ?  ?  ?  ?  ?  ?  ?  ?General Comments  orthostatic BP with sitting > standing ? ?  ?Exercises   ?  ?Shoulder Instructions    ? ? ?Home Living Family/patient expects to be discharged to:: Private residence ?Living Arrangements: Alone ?Available Help at Discharge: Family (son is staying for first week) ?Type of Home: House ?Home Access: Ramped entrance (at back of house) ?  ?  ?Home Layout: One level ?  ?  ?Bathroom Shower/Tub: Walk-in shower ?  ?Bathroom Toilet: Handicapped height ?  ?  ?Home Equipment: Air cabin crew (4 wheels);Cane - single point ?  ?  ?  ? ?  ?Prior Functioning/Environment Prior Level of Function : Independent/Modified Independent;Driving ?  ?  ?  ?  ?  ?  ?Mobility Comments: uses cane around home, rollator for community ?ADLs Comments: does IADLs ?  ? ?  ?  ?OT Problem List: Decreased strength;Decreased range of motion;Decreased activity tolerance;Impaired balance (sitting and/or standing);Decreased knowledge of use of DME or AE;Cardiopulmonary status limiting activity ?  ?   ?OT Treatment/Interventions: Self-care/ADL training;Therapeutic exercise;DME and/or AE instruction;Energy conservation;Therapeutic activities;Patient/family education;Balance training  ?   ?OT Goals(Current goals can be found in the care plan section) Acute Rehab OT Goals ?Patient Stated Goal: decrease pain ?OT Goal Formulation: With patient ?Time For Goal Achievement: 04/16/22 ?Potential to Achieve Goals: Good ?ADL Goals ?Pt Will Perform Upper Body Dressing: with supervision;sitting ?Pt Will Perform Lower Body Dressing: with mod assist;sitting/lateral leans;with adaptive equipment ?Pt Will Transfer to Toilet: with supervision;ambulating;regular height toilet ?Pt Will Perform Tub/Shower Transfer: Tub transfer;Shower transfer;with min assist;rolling walker;ambulating  ?OT Frequency: Min 3X/week ?  ? ?Co-evaluation   ?  ?  ?  ?  ? ?  ?AM-PAC OT "6 Clicks" Daily Activity     ?Outcome Measure Help from another person eating meals?: None ?Help from another person taking care of personal grooming?: A Little ?Help from another person toileting, which includes using toliet, bedpan, or urinal?: A Lot ?Help from another person bathing (including washing, rinsing, drying)?: A Lot ?Help from another person to put on and taking off regular upper body clothing?: A Little ?Help from another person to put on and taking off regular lower body clothing?: A Lot ?6 Click Score: 16 ?  ?End of Session Equipment Utilized During Treatment: Gait belt;Rolling walker (2 wheels) ?Nurse Communication: Mobility status ? ?Activity Tolerance: Patient tolerated treatment well;Patient limited by fatigue;Patient limited by pain ?Patient left: in bed;with call bell/phone within reach;with bed alarm set ? ?OT Visit Diagnosis: Unsteadiness on feet (R26.81);Other abnormalities of gait and mobility (R26.89);Muscle weakness (generalized) (M62.81);Pain ?  Pain - Right/Left: Left ?Pain - part of body: Hip  ?              ?Time: 1610-9604 ?OT Time Calculation (min): 34 min ?Charges:  OT General Charges ?$OT Visit: 1 Visit ?OT Evaluation ?$OT Eval Moderate Complexity: 1 Mod ?OT Treatments ?$Therapeutic Activity: 8-22 mins ? ?Lynnda Child, OTD,  OTR/L ?Acute Rehab ?[phone] ? ?Kaylyn Lim ?04/02/2022, 3:58 PM ?

## 2022-04-02 NOTE — Progress Notes (Addendum)
Subjective: ?Patient very drowsy this morning and has nausea when up with PT..  has only ambulated from bed to chair.  ? ?Objective: ?Vital signs in last 24 hours: ?Temp:  [97.2 ?F (36.2 ?C)-98.9 ?F (37.2 ?C)] 98.4 ?F (36.9 ?C) (04/03 2000) ?Pulse Rate:  [78-88] 79 (04/04 0816) ?Resp:  [10-24] 20 (04/04 0816) ?BP: (67-154)/(45-77) 98/50 (04/04 0816) ?SpO2:  [93 %-100 %] 100 % (04/03 2000) ?Weight:  [103.9 kg] 103.9 kg (04/03 1108) ? ?Intake/Output from previous day: ?04/03 0701 - 04/04 0700 ?In: 2779.3 [P.O.:480; I.V.:2049.3; IV Piggyback:250] ?Out: 1790 [Urine:990; Blood:800] ?Intake/Output this shift: ?No intake/output data recorded. ? ?Recent Labs  ?  04/02/22 ?0148  ?HGB 9.1*  ? ?Recent Labs  ?  04/02/22 ?0148  ?WBC 14.0*  ?RBC 3.69*  ?HCT 29.3*  ?PLT 229  ? ?Recent Labs  ?  04/02/22 ?0148  ?NA 138  ?K 4.0  ?CL 102  ?CO2 30  ?BUN 14  ?CREATININE 0.75  ?GLUCOSE 138*  ?CALCIUM 9.1  ? ?No results for input(s): LABPT, INR in the last 72 hours. ? ?Exam ?Patient very drowsy this morning.  Likely secondary to postop narcotics.  Left hip dressing clean dry and intact.  Neurovascular intact. ? ? ? ?Assessment/Plan: ?-Drowsy and nauseated.  DC Dilaudid IV and oxycodone.  Change p.o. medication to Norco. ?-Patient slow moving with PT and with the above issue will not discharge home today.  will see how she does tomorrow.  Admission status will be changed to inpatient. ? ?Benjiman Core ?04/02/2022, 10:01 AM  ? ? ? ? ?

## 2022-04-02 NOTE — Progress Notes (Signed)
Pt noted with temp-100 and O2 sats 78% on room air. Pt resting quietly and unaware of desaturation. O2 at 2liters placed on pt via [place], O2 sats increased to 92%. BP-96/46, however consistent with readings throughout the day, asymptomatic. Pt has not voided since last in and out cath at 1700. Reports pain to surgical site with activity, otherwise denies pain. On call ortho MD, Dr. Sammuel Hines made aware of findings, new orders noted. Pt stable, no distress noted at present time.  ?

## 2022-04-02 NOTE — Progress Notes (Signed)
Physical Therapy Treatment ?Patient Details ?Name: Meghan Miller ?MRN: [id number] ?DOB: 1936/08/16 ?Today's Date: 04/02/2022 ? ? ?History of Present Illness Pt is a 86 y.o. F who presents s/p L THA, direct anterior 04/01/2022. Significant PMH: skin CA, PVD, bilateral TKA's. ? ?  ?PT Comments  ? ? Pt progressing incrementally towards her physical therapy goals. Continues with mild nausea, but no vomiting this session. BP 124/54 sitting edge of bed. Pt requiring moderate assist for transfers and ambulating 40 ft with a walker at a min assist level. Pt with one episode of right knee buckle. Emphasis on neutral left foot positioning and controlled step length. Based on pt report, pt fairly sedentary at baseline, uses a lift chair, and sleeps in a recliner at night. Will likely progress better tomorrow with improved pain control and nausea. ?  ?Recommendations for follow up therapy are one component of a multi-disciplinary discharge planning process, led by the attending physician.  Recommendations may be updated based on patient status, additional functional criteria and insurance authorization. ? ?Follow Up Recommendations ? Follow physician's recommendations for discharge plan and follow up therapies ?  ?  ?Assistance Recommended at Discharge PRN  ?Patient can return home with the following A little help with walking and/or transfers;A little help with bathing/dressing/bathroom;Assistance with cooking/housework;Assist for transportation;Help with stairs or ramp for entrance ?  ?Equipment Recommendations ? Rolling walker (2 wheels);BSC/3in1  ?  ?Recommendations for Other Services   ? ? ?  ?Precautions / Restrictions Precautions ?Precautions: Fall;Other (comment) ?Precaution Comments: watch BP ?Restrictions ?Weight Bearing Restrictions: Yes ?LLE Weight Bearing: Weight bearing as tolerated  ?  ? ?Mobility ? Bed Mobility ?Overal bed mobility: Needs Assistance ?Bed Mobility: Supine to Sit, Sit to Supine ?  ?  ?Supine to sit:  Min assist ?Sit to supine: Min assist ?  ?General bed mobility comments: Pt with use of gait belt to progress LLE out of bed, HOB elevated, minA to scoot left hip forward using bed pad. Assist for LLE elevation back into bed. Of note, pt typically sleeps in recliner ?  ? ?Transfers ?Overall transfer level: Needs assistance ?Equipment used: Rolling walker (2 wheels) ?Transfers: Sit to/from Stand ?Sit to Stand: Mod assist ?  ?Step pivot transfers: Min guard ?  ?  ?  ?General transfer comment: Pt requiring modA to stand from elevated bed height. Cues for rocking to gain momentum and nose over toes ?  ? ?Ambulation/Gait ?Ambulation/Gait assistance: Min assist ?Gait Distance (Feet): 40 Feet ?Assistive device: Rolling walker (2 wheels) ?Gait Pattern/deviations: Step-to pattern, [place], Decreased stance time - left, Decreased weight shift to left ?Gait velocity: decreased ?Gait velocity interpretation: <1.8 ft/sec, indicate of risk for recurrent falls ?  ?General Gait Details: Cues for neutral L foot positioning, smaller step, sequencing/technique. Pt with one episode of R knee buckle, requiring minA to correct ? ? ?Stairs ?  ?  ?  ?  ?  ? ? ?Wheelchair Mobility ?  ? ?Modified Rankin (Stroke Patients Only) ?  ? ? ?  ?Balance Overall balance assessment: Needs assistance ?Sitting-balance support: Feet supported ?Sitting balance-Leahy Scale: Fair ?  ?  ?Standing balance support: Bilateral upper extremity supported, Reliant on assistive device for balance ?Standing balance-Leahy Scale: Poor ?Standing balance comment: reliant on RW support ?  ?  ?  ?  ?  ?  ?  ?  ?  ?  ?  ?  ? ?  ?Cognition Arousal/Alertness: Awake/alert ?Behavior During Therapy: [facility] for tasks assessed/performed ?Overall Cognitive Status: Within  Functional Limits for tasks assessed ?  ?  ?  ?  ?  ?  ?  ?  ?  ?  ?  ?  ?  ?  ?  ?  ?  ?  ?  ? ?  ?Exercises General Exercises - Lower Extremity ?Ankle Circles/Pumps: Both, 5 reps, Supine ?Quad Sets: Both, 10 reps,  Supine ?Long Arc Quad: AAROM, Left, 10 reps, Seated ?Heel Slides: AAROM, Left, 5 reps, Supine ?Hip ABduction/ADduction: AAROM, Left, 5 reps, Supine ? ?  ?General Comments General comments (skin integrity, edema, etc.): orthostatic BP with sitting > standing ?  ?  ? ?Pertinent Vitals/Pain Pain Assessment ?Pain Assessment: Faces ?Faces Pain Scale: Hurts even more ?Pain Location: L hip ?Pain Descriptors / Indicators: Grimacing, Operative site guarding ?Pain Intervention(s): Limited activity within patient's tolerance, Monitored during session  ? ? ?Home Living Family/patient expects to be discharged to:: Private residence ?Living Arrangements: Alone ?Available Help at Discharge: Family (son is staying for first week) ?Type of Home: House ?Home Access: Ramped entrance (at back of house) ?  ?  ?  ?Home Layout: One level ?Home Equipment: Air cabin crew (4 wheels);Cane - single point ?   ?  ?Prior Function    ?  ?  ?   ? ?PT Goals (current goals can now be found in the care plan section) Acute Rehab PT Goals ?Patient Stated Goal: less nausea ?Potential to Achieve Goals: Good ?Progress towards PT goals: Progressing toward goals ? ?  ?Frequency ? ? ? 7X/week ? ? ? ?  ?PT Plan Current plan remains appropriate  ? ? ?Co-evaluation   ?  ?  ?  ?  ? ?  ?AM-PAC PT "6 Clicks" Mobility   ?Outcome Measure ? Help needed turning from your back to your side while in a flat bed without using bedrails?: A Little ?Help needed moving from lying on your back to sitting on the side of a flat bed without using bedrails?: A Little ?Help needed moving to and from a bed to a chair (including a wheelchair)?: A Little ?Help needed standing up from a chair using your arms (e.g., wheelchair or bedside chair)?: A Lot ?Help needed to walk in hospital room?: A Little ?Help needed climbing 3-5 steps with a railing? : A Lot ?6 Click Score: 16 ? ?  ?End of Session Equipment Utilized During Treatment: Gait belt ?Activity Tolerance: Patient limited by  pain ?Patient left: in bed;with call bell/phone within reach ?Nurse Communication: Mobility status ?PT Visit Diagnosis: Unsteadiness on feet (R26.81);Other abnormalities of gait and mobility (R26.89);Difficulty in walking, not elsewhere classified (R26.2);Pain ?Pain - Right/Left: Left ?Pain - part of body: Hip ?  ? ? ?Time: 8250-0370 ?PT Time Calculation (min) (ACUTE ONLY): 35 min ? ?Charges:  $Gait Training: 8-22 mins ?$Therapeutic Activity: 8-22 mins          ?          ? ?Wyona Almas, PT, DPT ?Acute Rehabilitation Services ?Pager [phone] ?Office [phone] ? ? ? ?Deno Etienne ?04/02/2022, 3:47 PM ? ?

## 2022-04-02 NOTE — Discharge Instructions (Addendum)
INSTRUCTIONS AFTER JOINT REPLACEMENT  ? ?Remove items at home which could result in a fall. This includes throw rugs or furniture in walking pathways ?ICE to the affected joint every three hours while awake for 30 minutes at a time, for at least the first 3-5 days, and then as needed for pain and swelling.  Continue to use ice for pain and swelling. You may notice swelling that will progress down to the foot and ankle.  This is normal after surgery.  Elevate your leg when you are not up walking on it.   ?Continue to use the breathing machine you got in the hospital (incentive spirometer) which will help keep your temperature down.  It is common for your temperature to cycle up and down following surgery, especially at night when you are not up moving around and exerting yourself.  The breathing machine keeps your lungs expanded and your temperature down. ? ? ?DIET:  As you were doing prior to hospitalization, we recommend a well-balanced diet. ? ?DRESSING / WOUND CARE / SHOWERING ? ?You may change your dressing 3-5 days after surgery.  Then change the dressing every day with sterile gauze.  Please use good hand washing techniques before changing the dressing.  Do not use any lotions or creams on the incision until instructed by your surgeon.  Your dressing is water proof and you can leave it on until you return to the office to see Dr. Lorin Mercy.  ? ?ACTIVITY ? ?Increase activity slowly as tolerated, but follow the weight bearing instructions below.   ?No driving for 6 weeks or until further direction given by your physician.  You cannot drive while taking narcotics.  ?No lifting or carrying greater than 10 lbs. until further directed by your surgeon. ?Avoid periods of inactivity such as sitting longer than an hour when not asleep. This helps prevent blood clots.  ?You may return to work once you are authorized by your doctor.  ? ? ? ?WEIGHT BEARING  ? ?Weight bearing as tolerated with assist device (walker, cane, etc)  as directed, use it as long as suggested by your surgeon or therapist, typically at least 4-6 weeks. ? ? ?EXERCISES ? ?Results after joint replacement surgery are often greatly improved when you follow the exercise, range of motion and muscle strengthening exercises prescribed by your doctor. Safety measures are also important to protect the joint from further injury. Any time any of these exercises cause you to have increased pain or swelling, decrease what you are doing until you are comfortable again and then slowly increase them. If you have problems or questions, call your caregiver or physical therapist for advice.  ? ?Rehabilitation is important following a joint replacement. After just a few days of immobilization, the muscles of the leg can become weakened and shrink (atrophy).  These exercises are designed to build up the tone and strength of the thigh and leg muscles and to improve motion. Often times heat used for twenty to thirty minutes before working out will loosen up your tissues and help with improving the range of motion but do not use heat for the first two weeks following surgery (sometimes heat can increase post-operative swelling).  ? ? ?A rehabilitation program following joint replacement surgery can speed recovery and prevent re-injury in the future due to weakened muscles. Contact your doctor or a physical therapist for more information on knee rehabilitation.  ? ? ?CONSTIPATION ? ?Constipation is defined medically as fewer than three stools per week and  severe constipation as less than one stool per week.  Even if you have a regular bowel pattern at home, your normal regimen is likely to be disrupted due to multiple reasons following surgery.  Combination of anesthesia, postoperative narcotics, change in appetite and fluid intake all can affect your bowels.  ? ?YOU MUST use at least one of the following options; they are listed in order of increasing strength to get the job done.  They are  all available over the counter, and you may need to use some, POSSIBLY even all of these options:   ? ?Drink plenty of fluids (prune juice may be helpful) and high fiber foods ?Colace 100 mg by mouth twice a day  ?Senokot for constipation as directed and as needed Dulcolax (bisacodyl), take with full glass of water  ?Miralax (polyethylene glycol) once or twice a day as needed. ? ?If you have tried all these things and are unable to have a bowel movement in the first 3-4 days after surgery call either your surgeon or your primary doctor.   ? ?If you experience loose stools or diarrhea, hold the medications until you stool forms back up.  If your symptoms do not get better within 1 week or if they get worse, check with your doctor.  If you experience "the worst abdominal pain ever" or develop nausea or vomiting, please contact the office immediately for further recommendations for treatment. ? ? ?ITCHING:  If you experience itching with your medications, try taking only a single pain pill, or even half a pain pill at a time.  You can also use Benadryl over the counter for itching or also to help with sleep.  ? ?TED HOSE STOCKINGS:  Use stockings on both legs until for at least 2 weeks or as directed by physician office. They may be removed at night for sleeping. ? ?MEDICATIONS:  See your medication summary on the ?After Visit Summary? that nursing will review with you.  You may have some home medications which will be placed on hold until you complete the course of blood thinner medication.  It is important for you to complete the blood thinner medication as prescribed. ? ?PRECAUTIONS:  If you experience chest pain or shortness of breath - call 911 immediately for transfer to the hospital emergency department.  ? ?If you develop a fever greater that 101 F, purulent drainage from wound, increased redness or drainage from wound, foul odor from the wound/dressing, or calf pain - CONTACT YOUR SURGEON.   ?                                                 ?FOLLOW-UP APPOINTMENTS:  If you do not already have a post-op appointment, please call the office for an appointment to be seen by your surgeon.  Guidelines for how soon to be seen are listed in your ?After Visit Summary?, but are typically between 1-4 weeks after surgery. ? ?OTHER INSTRUCTIONS:  ? ?Knee Replacement:  Do not place pillow under knee, focus on keeping the knee straight while resting. CPM instructions: 0-90 degrees, 2 hours in the morning, 2 hours in the afternoon, and 2 hours in the evening. Place foam block, curve side up under heel at all times except when in CPM or when walking.  DO NOT modify, tear, cut, or change the foam block in  any way. ? ?POST-OPERATIVE OPIOID TAPER INSTRUCTIONS: ?It is important to wean off of your opioid medication as soon as possible. If you do not need pain medication after your surgery it is ok to stop day one. ?Opioids include: ?Codeine, Hydrocodone(Norco, Vicodin), Oxycodone(Percocet, oxycontin) and hydromorphone amongst others.  ?Long term and even short term use of opiods can cause: ?Increased pain response ?Dependence ?Constipation ?Depression ?Respiratory depression ?And more.  ?Withdrawal symptoms can include ?Flu like symptoms ?Nausea, vomiting ?And more ?Techniques to manage these symptoms ?Hydrate well ?Eat regular healthy meals ?Stay active ?Use relaxation techniques(deep breathing, meditating, yoga) ?Do Not substitute Alcohol to help with tapering ?If you have been on opioids for less than two weeks and do not have pain than it is ok to stop all together.  ?Plan to wean off of opioids ?This plan should start within one week post op of your joint replacement. ?Maintain the same interval or time between taking each dose and first decrease the dose.  ?Cut the total daily intake of opioids by one tablet each day ?Next start to increase the time between doses. ?The last dose that should be eliminated is the evening dose.  ? ?MAKE  SURE YOU:  ?Understand these instructions.  ?Get help right away if you are not doing well or get worse.  ? ? ?Thank you for letting us be a part of your medical care team.  It is a privilege we respec

## 2022-04-02 NOTE — Telephone Encounter (Signed)
Alma Friendly called from [facility] about patient Meghan Miller in [place], stating she knows you were potentially going to discharge but everytime she moves and gets up she throws up.  ?Her cb# is [phone] if needed  ? ?

## 2022-04-02 NOTE — Evaluation (Signed)
Physical Therapy Evaluation ?Patient Details ?Name: Meghan Miller ?MRN: [id number] ?DOB: [birth year] ?Today's Date: 04/02/2022 ? ?History of Present Illness ? Pt is a 86 y.o. F who presents s/p L THA, direct anterior 04/01/2022. Significant PMH: skin CA, PVD, bilateral TKA's.  ?Clinical Impression ? Pt admitted s/p L THA. Presents with decreased functional mobility secondary to pain, LLE weakness, impaired balance, and gait abnormalities. Pt requiring moderate assist for bed mobility and minimal assist for transfers from bed to chair. Further ambulation deferred in light of pt nausea/vomiting while edge of bed and soft BP (see below). RN notified and present to provide medication. Initiated reviewing HEP for LLE strengthening/ROM (written instruction provided). Will continue to progress as tolerated. ? ?Vitals: ?BP supine: 98/50 (61) ?BP sitting: 77/57 (63) ?BP sitting x 3 minutes: 92/73 (82) ?BP sitting in recliner post mobility: 97/55 (68) ?   ? ?Recommendations for follow up therapy are one component of a multi-disciplinary discharge planning process, led by the attending physician.  Recommendations may be updated based on patient status, additional functional criteria and insurance authorization. ? ?Follow Up Recommendations Follow physician's recommendations for discharge plan and follow up therapies ? ?  ?Assistance Recommended at Discharge PRN  ?Patient can return home with the following ? A little help with walking and/or transfers;A little help with bathing/dressing/bathroom;Assistance with cooking/housework;Assist for transportation;Help with stairs or ramp for entrance ? ?  ?Equipment Recommendations Rolling walker (2 wheels);BSC/3in1  ?Recommendations for Other Services ? OT consult  ?  ?Functional Status Assessment Patient has had a recent decline in their functional status and demonstrates the ability to make significant improvements in function in a reasonable and predictable amount of time.  ? ?   ?Precautions / Restrictions Precautions ?Precautions: Fall ?Restrictions ?Weight Bearing Restrictions: Yes ?LLE Weight Bearing: Weight bearing as tolerated  ? ?  ? ?Mobility ? Bed Mobility ?Overal bed mobility: Needs Assistance ?Bed Mobility: Supine to Sit ?  ?  ?Supine to sit: Mod assist ?  ?  ?General bed mobility comments: Assist with LLE and trunk to upright. Increased time/effort ?  ? ?Transfers ?Overall transfer level: Needs assistance ?Equipment used: Rolling walker (2 wheels) ?Transfers: Sit to/from Stand, Bed to chair/wheelchair/BSC ?Sit to Stand: Min assist ?  ?Step pivot transfers: Min guard ?  ?  ?  ?General transfer comment: Pt requiring minA to boost up from elevated surface. Min cues for sequencing/technique with pivotal steps from bed to chair ?  ? ?Ambulation/Gait ?  ?  ?  ?  ?  ?  ?  ?  ? ?Stairs ?  ?  ?  ?  ?  ? ?Wheelchair Mobility ?  ? ?Modified Rankin (Stroke Patients Only) ?  ? ?  ? ?Balance Overall balance assessment: Needs assistance ?Sitting-balance support: Feet supported ?Sitting balance-Leahy Scale: Fair ?  ?  ?Standing balance support: Bilateral upper extremity supported, Reliant on assistive device for balance ?Standing balance-Leahy Scale: Poor ?  ?  ?  ?  ?  ?  ?  ?  ?  ?  ?  ?  ?   ? ? ? ?Pertinent Vitals/Pain Pain Assessment ?Pain Assessment: Faces ?Faces Pain Scale: Hurts whole lot ?Pain Location: L hip ?Pain Descriptors / Indicators: Grimacing, Operative site guarding ?Pain Intervention(s): Limited activity within patient's tolerance, Monitored during session, Premedicated before session  ? ? ?Home Living Family/patient expects to be discharged to:: Private residence ?Living Arrangements: Alone ?Available Help at Discharge: Family (son staying for first week) ?Type of Home: House ?  Home Access: Ramped entrance (in back) ?  ?  ?  ?Home Layout: One level ?Home Equipment: Air cabin crew (4 wheels) ?   ?  ?Prior Function Prior Level of Function : Independent/Modified  Independent;Driving ?  ?  ?  ?  ?  ?  ?  ?  ?  ? ? ?Hand Dominance  ?   ? ?  ?Extremity/Trunk Assessment  ? Upper Extremity Assessment ?Upper Extremity Assessment: Defer to OT evaluation ?  ? ?Lower Extremity Assessment ?Lower Extremity Assessment: LLE deficits/detail ?LLE Deficits / Details: s/p THA. Grossly 2/5, other than ankle dorsiflexion 5/5 ?  ? ?   ?Communication  ? Communication: No difficulties  ?Cognition Arousal/Alertness: Awake/alert ?Behavior During Therapy: [facility] for tasks assessed/performed ?Overall Cognitive Status: Within Functional Limits for tasks assessed ?  ?  ?  ?  ?  ?  ?  ?  ?  ?  ?  ?  ?  ?  ?  ?  ?  ?  ?  ? ?  ?General Comments   ? ?  ?Exercises General Exercises - Lower Extremity ?Ankle Circles/Pumps: Both, 20 reps, Supine ?Quad Sets: Both, 10 reps, Supine ?Long Arc Quad: AAROM, Left, Seated, Other (comment) (2 reps) ?Heel Slides: AAROM, Left, 5 reps, Supine ?Hip ABduction/ADduction: AAROM, Left, 5 reps, Supine  ? ?Assessment/Plan  ?  ?PT Assessment Patient needs continued PT services  ?PT Problem List Decreased strength;Decreased activity tolerance;Decreased balance;Decreased mobility;Pain ? ?   ?  ?PT Treatment Interventions DME instruction;Gait training;Stair training;Therapeutic activities;Therapeutic exercise;Functional mobility training;Balance training;Patient/family education   ? ?PT Goals (Current goals can be found in the Care Plan section)  ?Acute Rehab PT Goals ?Patient Stated Goal: less nausea ?PT Goal Formulation: With patient ?Time For Goal Achievement: 04/16/22 ?Potential to Achieve Goals: Good ? ?  ?Frequency 7X/week ?  ? ? ?Co-evaluation   ?  ?  ?  ?  ? ? ?  ?AM-PAC PT "6 Clicks" Mobility  ?Outcome Measure Help needed turning from your back to your side while in a flat bed without using bedrails?: A Little ?Help needed moving from lying on your back to sitting on the side of a flat bed without using bedrails?: A Lot ?Help needed moving to and from a bed to a chair  (including a wheelchair)?: A Little ?Help needed standing up from a chair using your arms (e.g., wheelchair or bedside chair)?: A Little ?Help needed to walk in hospital room?: A Little ?Help needed climbing 3-5 steps with a railing? : Total ?6 Click Score: 15 ? ?  ?End of Session   ?Activity Tolerance: Patient limited by pain;Other (comment) (limited by nausea) ?Patient left: in chair;with chair alarm set ?Nurse Communication: Mobility status ?PT Visit Diagnosis: Unsteadiness on feet (R26.81);Other abnormalities of gait and mobility (R26.89);Difficulty in walking, not elsewhere classified (R26.2);Pain ?Pain - Right/Left: Left ?Pain - part of body: Hip ?  ? ?Time: 6578-4696 ?PT Time Calculation (min) (ACUTE ONLY): 27 min ? ? ?Charges:   PT Evaluation ?$PT Eval Low Complexity: 1 Low ?PT Treatments ?$Therapeutic Exercise: 8-22 mins ?  ?   ? ? ?Wyona Almas, PT, DPT ?Acute Rehabilitation Services ?Pager [phone] ?Office [phone] ? ? ?Deno Etienne ?04/02/2022, 10:19 AM ? ?

## 2022-04-03 ENCOUNTER — Inpatient Hospital Stay (HOSPITAL_COMMUNITY): Payer: Medicare Other

## 2022-04-03 DIAGNOSIS — D62 Acute posthemorrhagic anemia: Secondary | ICD-10-CM | POA: Diagnosis not present

## 2022-04-03 LAB — CBC
HCT: 28.1 % — ABNORMAL LOW (ref 36.0–46.0)
Hemoglobin: 8.3 g/dL — ABNORMAL LOW (ref 12.0–15.0)
MCH: 23.9 pg — ABNORMAL LOW (ref 26.0–34.0)
MCHC: 29.5 g/dL — ABNORMAL LOW (ref 30.0–36.0)
MCV: 81 fL (ref 80.0–100.0)
Platelets: 189 10*3/uL (ref 150–400)
RBC: 3.47 MIL/uL — ABNORMAL LOW (ref 3.87–5.11)
RDW: 16.9 % — ABNORMAL HIGH (ref 11.5–15.5)
WBC: 11 10*3/uL — ABNORMAL HIGH (ref 4.0–10.5)
nRBC: 0 % (ref 0.0–0.2)

## 2022-04-03 LAB — D-DIMER, QUANTITATIVE: D-Dimer, Quant: 1.57 ug/mL-FEU — ABNORMAL HIGH (ref 0.00–0.50)

## 2022-04-03 LAB — PREPARE RBC (CROSSMATCH)

## 2022-04-03 MED ORDER — SODIUM CHLORIDE 0.9% IV SOLUTION: Freq: Once | INTRAVENOUS | Status: AC

## 2022-04-03 NOTE — Progress Notes (Addendum)
Patient ID: Meghan Miller, female   DOB: [birth year], 86 y.o.   MRN: [id number] ?Patient with symptomatic anemia with current hemoglobin 8.3.  This normally would be adequate postop after total hip arthroplasty unfortunately since she has history of COPD she gets dyspneic and drops her sats in the high 70s after walking 30 feet and gets dizzy.  On 2 L oxygen she is better.  Called and discussed with patient on the phone options and she agrees to proceed with 2 units of transfusion packed cells to increase oxygen carrying capacity.  If this is not effective in taking care of her desaturation and dizziness problem we may need to order home oxygen for her.  She has not smoked in years and has had COPD stable for many years.  We will check a venous Doppler and D-dimer in addition.  With transfusion of 2 units of packed cells she likely will be here until tomorrow and see how she does with therapy tomorrow. ?

## 2022-04-03 NOTE — Plan of Care (Signed)
  Problem: Pain Managment: Goal: General experience of comfort will improve Outcome: Progressing   Problem: Safety: Goal: Ability to remain free from injury will improve Outcome: Progressing   

## 2022-04-03 NOTE — Progress Notes (Signed)
Patient ID: Meghan Miller, female   DOB: 1936-07-08, 86 y.o.   MRN: [id number] ? ? ?Subjective: ?2 Days Post-Op Procedure(s) (LRB): ?LEFT TOTAL HIP ARTHROPLASTY ANTERIOR APPROACH (Left) ?Patient reports pain as moderate.  No dyspnea . Adequate UO.  ? ?Objective: ?Vital signs in last 24 hours: ?Temp:  [98.2 ?F (36.8 ?C)-100 ?F (37.8 ?C)] 100 ?F (37.8 ?C) (04/05 0744) ?Pulse Rate:  [78-97] 88 (04/05 0744) ?Resp:  [17-21] 20 (04/05 0744) ?BP: (90-113)/(42-55) 106/53 (04/05 0744) ?SpO2:  [84 %-100 %] 92 % (04/05 0744) ? ?Intake/Output from previous day: ?04/04 0701 - 04/05 0700 ?In: 1680 [P.O.:1680] ?Out: 1000 [Urine:1000] ?Intake/Output this shift: ?No intake/output data recorded. ? ?Recent Labs  ?  04/02/22 ?0148 04/03/22 ?0325  ?HGB 9.1* 8.3*  ? ?Recent Labs  ?  04/02/22 ?0148 04/03/22 ?0325  ?WBC 14.0* 11.0*  ?RBC 3.69* 3.47*  ?HCT 29.3* 28.1*  ?PLT 229 189  ? ?Recent Labs  ?  04/02/22 ?0148  ?NA 138  ?K 4.0  ?CL 102  ?CO2 30  ?BUN 14  ?CREATININE 0.75  ?GLUCOSE 138*  ?CALCIUM 9.1  ? ?No results for input(s): LABPT, INR in the last 72 hours. ? ?Neurologically intact ?No results found. ? ?Assessment/Plan: ?2 Days Post-Op Procedure(s) (LRB): ?LEFT TOTAL HIP ARTHROPLASTY ANTERIOR APPROACH (Left) ?Up with therapy, ambulated in the halls. Likely home this afternoon.  ? ?Meghan Miller ?04/03/2022, 8:05 AM ? ? ? ? ? ?

## 2022-04-03 NOTE — Progress Notes (Signed)
Pt noted with stable vital signs throughout the night. Given vicodin x1 this am for reports of left leg pain-resting upon assessment. Total urine output 700 ml so far this shift. O2 spot checked on room air x2, O2 sats ranging from high 70s to mid 80s while resting. O2 at 2lpm via [place] remains. No distress noted.  ?

## 2022-04-03 NOTE — Progress Notes (Signed)
Physical Therapy Treatment ?Patient Details ?Name: Meghan Miller ?MRN: [id number] ?DOB: Oct 23, 1936 ?Today's Date: 04/03/2022 ? ? ?History of Present Illness Pt is a 86 y.o. F who presents s/p L THA, direct anterior 04/01/2022. Significant PMH: skin CA, PVD, bilateral TKA's. ? ?  ?PT Comments  ? ? Pt received supine in the bed; currently getting blood transfusion and deferring getting out of bed, stating she sat up in the chair until 2 PM. Pt agreeable to review and perform BLE HEP for strengthening and ROM (written instruction provided). Will continue to progress tomorrow. ?   ?Recommendations for follow up therapy are one component of a multi-disciplinary discharge planning process, led by the attending physician.  Recommendations may be updated based on patient status, additional functional criteria and insurance authorization. ? ?Follow Up Recommendations ? Follow physician's recommendations for discharge plan and follow up therapies ?  ?  ?Assistance Recommended at Discharge PRN  ?Patient can return home with the following A little help with walking and/or transfers;A little help with bathing/dressing/bathroom;Assistance with cooking/housework;Assist for transportation;Help with stairs or ramp for entrance ?  ?Equipment Recommendations ? Rolling walker (2 wheels);BSC/3in1  ?  ?Recommendations for Other Services   ? ? ?  ?Precautions / Restrictions Precautions ?Precautions: Fall;Other (comment) ?Precaution Comments: watch BP, O2 ?Restrictions ?Weight Bearing Restrictions: Yes ?LLE Weight Bearing: Weight bearing as tolerated  ?  ? ?Mobility ? Bed Mobility ?  ?  ?  ?  ?  ?  ?  ?General bed mobility comments: deferred ?  ? ?Transfers ?Overall transfer level: Needs assistance ?Equipment used: Rolling walker (2 wheels) ?Transfers: Sit to/from Stand ?Sit to Stand: Min assist, Mod assist ?  ?  ?  ?  ?  ?General transfer comment: deferred ?  ? ?Ambulation/Gait ?Ambulation/Gait assistance: Min guard ?Gait Distance (Feet): 50  Feet (30 then 20) ?Assistive device: Rolling walker (2 wheels) ?Gait Pattern/deviations: Step-to pattern, Antalgic, Decreased stance time - left, Decreased weight shift to left ?Gait velocity: decreased ?  ?  ?General Gait Details: Cues for neutral L foot positioning, smaller step, sequencing/technique. Fatigues easily ? ? ?Stairs ?  ?  ?  ?  ?  ? ? ?Wheelchair Mobility ?  ? ?Modified Rankin (Stroke Patients Only) ?  ? ? ?  ?Balance Overall balance assessment: Needs assistance ?Sitting-balance support: Feet supported ?Sitting balance-Leahy Scale: Fair ?  ?  ?Standing balance support: Bilateral upper extremity supported, Reliant on assistive device for balance ?Standing balance-Leahy Scale: Poor ?Standing balance comment: reliant on RW support ?  ?  ?  ?  ?  ?  ?  ?  ?  ?  ?  ?  ? ?  ?Cognition Arousal/Alertness: Awake/alert ?Behavior During Therapy: [facility] for tasks assessed/performed ?Overall Cognitive Status: Within Functional Limits for tasks assessed ?  ?  ?  ?  ?  ?  ?  ?  ?  ?  ?  ?  ?  ?  ?  ?  ?  ?  ?  ? ?  ?Exercises General Exercises - Lower Extremity ?Quad Sets: Both, 15 reps, Supine ?Gluteal Sets: Both, 15 reps, Supine ?Short Arc Quad: Both, 15 reps, Supine ?Long Arc Quad: AAROM, 10 reps, Seated, Both, AROM ?Heel Slides: AROM, AAROM, Both, 10 reps, Supine ?Hip ABduction/ADduction: AROM, AAROM, Both, 10 reps, Supine ?Straight Leg Raises: Right, 10 reps, Supine ? ?  ?General Comments   ?  ?  ? ?Pertinent Vitals/Pain Pain Assessment ?Pain Assessment: Faces ?Faces Pain Scale: Hurts even more ?  Pain Location: L hip ?Pain Descriptors / Indicators: Grimacing, Operative site guarding ?Pain Intervention(s): Monitored during session, Repositioned  ? ? ?Home Living   ?  ?  ?  ?  ?  ?  ?  ?  ?  ?   ?  ?Prior Function    ?  ?  ?   ? ?PT Goals (current goals can now be found in the care plan section) Acute Rehab PT Goals ?Patient Stated Goal: less nausea ?Potential to Achieve Goals: Good ?Progress towards PT goals:  Progressing toward goals ? ?  ?Frequency ? ? ? 7X/week ? ? ? ?  ?PT Plan Current plan remains appropriate  ? ? ?Co-evaluation   ?  ?  ?  ?  ? ?  ?AM-PAC PT "6 Clicks" Mobility   ?Outcome Measure ? Help needed turning from your back to your side while in a flat bed without using bedrails?: A Little ?Help needed moving from lying on your back to sitting on the side of a flat bed without using bedrails?: A Little ?Help needed moving to and from a bed to a chair (including a wheelchair)?: A Little ?Help needed standing up from a chair using your arms (e.g., wheelchair or bedside chair)?: A Lot ?Help needed to walk in hospital room?: A Little ?Help needed climbing 3-5 steps with a railing? : A Lot ?6 Click Score: 16 ? ?  ?End of Session   ?Activity Tolerance: Patient limited by pain ?Patient left: with call bell/phone within reach;in chair ?Nurse Communication: Mobility status ?PT Visit Diagnosis: Unsteadiness on feet (R26.81);Other abnormalities of gait and mobility (R26.89);Difficulty in walking, not elsewhere classified (R26.2);Pain ?Pain - Right/Left: Left ?Pain - part of body: Hip ?  ? ? ?Time: 3154-0086 ?PT Time Calculation (min) (ACUTE ONLY): 14 min ? ?Charges:  $Therapeutic Exercise: 8-22 mins          ?          ? ?Meghan Miller, PT, DPT ?Acute Rehabilitation Services ?Pager [phone] ?Office [phone] ? ? ? ?Deno Etienne ?04/03/2022, 5:27 PM ? ?

## 2022-04-03 NOTE — TOC Initial Note (Signed)
Transition of Care (TOC) - Initial/Assessment Note  ? ? ?Patient Details  ?Name: Meghan Miller ?MRN: [id number] ?Date of Birth: 07-Mar-1936 ? ?Transition of Care [facility]) CM/SW Contact:    ?Sharin Mons, RN ?Phone Number: ?04/03/2022, 1:08 PM ? ?Clinical Narrative:       ?       -      s/p L THA, 04/01/2022      ?From home alone. PTA independent with ADL's, no DME usage. States son to assist and care for her once d/c. ?Pt agreeable to DME : RW, and BSC. Referral made with [employer]. Equipment will be delivered to bedside prior to discharge. ? ?Pt's son to provide transportation to home. ? ?Pt without Rx med concerns. ? ?Post hospital f/u noted on AVS. ? ?TOC team following and will continue to assist with needs.... ? ?Expected Discharge Plan: Home/Self Care ?Barriers to Discharge: Continued Medical Work up ? ? ?Patient Goals and CMS Choice ?  ?  ?Choice offered to / list presented to : Patient ? ?Expected Discharge Plan and Services ?Expected Discharge Plan: Home/Self Care ?  ?Discharge Planning Services: CM Consult ?  ?  ?                ?DME Arranged: 3-N-1, Walker rolling ?DME Agency: Other - Comment ([place]) ?Date DME Agency Contacted: 04/03/22 ?Time DME Agency Contacted: [phone] ?Representative spoke with at DME Agency: Brenton Grills ?  ?  ?  ?  ?  ? ?Prior Living Arrangements/Services ?  ?Lives with:: Adult Children ?  ?       ?  ?  ?  ?  ? ?Activities of Daily Living ?Home Assistive Devices/Equipment: Eyeglasses, Kasandra Knudsen (specify quad or straight), Scales, Walker (specify type) ?ADL Screening (condition at time of admission) ?Patient's cognitive ability adequate to safely complete daily activities?: Yes ?Is the patient deaf or have difficulty hearing?: No ?Does the patient have difficulty seeing, even when wearing glasses/contacts?: No ?Does the patient have difficulty concentrating, remembering, or making decisions?: No ?Patient able to express need for assistance with ADLs?: Yes ?Does the patient have difficulty dressing  or bathing?: Yes ?Independently performs ADLs?: No ?Communication: Independent ?Dressing (OT): Needs assistance ?Is this a change from baseline?: Change from baseline, expected to last <3days ?Grooming: Independent ?Feeding: Independent ?Bathing: Needs assistance ?Is this a change from baseline?: Change from baseline, expected to last >3 days ?Toileting: Needs assistance ?Is this a change from baseline?: Change from baseline, expected to last >3days ?In/Out Bed: Needs assistance ?Is this a change from baseline?: Change from baseline, expected to last >3 days ?Walks in Home: Needs assistance ?Is this a change from baseline?: Change from baseline, expected to last >3 days ?Does the patient have difficulty walking or climbing stairs?: Yes ?Weakness of Legs: Left ?Weakness of Arms/Hands: None ? ?Permission Sought/Granted ?  ?  ?   ?   ?   ?   ? ?Emotional Assessment ?  ?  ?  ?  ?  ?  ? ?Admission diagnosis:  Arthritis of left hip [M16.12] ?Patient Active Problem List  ? Diagnosis Date Noted  ? Acute blood loss anemia 04/03/2022  ? Arthritis of left hip 04/01/2022  ? Unilateral primary osteoarthritis, left hip 11/29/2021  ? ?PCP:  Neale Burly, MD ?Pharmacy:   ?[facility], [place] [address] ?[address] ?[place] [address] ?Phone: [phone] Fax: [phone] ? ? ? ? ?Social Determinants of Health (SDOH) Interventions ?  ? ?  Readmission Risk Interventions ?   ? View : No data to display.  ?  ?  ?  ? ? ? ?

## 2022-04-03 NOTE — Progress Notes (Signed)
Physical Therapy Treatment ?Patient Details ?Name: Meghan Miller ?MRN: [id number] ?DOB: [birth year] ?Today's Date: 04/03/2022 ? ? ?History of Present Illness Pt is a 86 y.o. F who presents s/p L THA, direct anterior 04/01/2022. Significant PMH: skin CA, PVD, bilateral TKA's. ? ?  ?PT Comments  ? ? Pt progressing slowly towards her physical therapy goals. Ambulating 30 ft with a walker before fatiguing and requiring seated rest break in hallway. SpO2 78% on RA, BP 105/48 (61); pt rebounded to 90% slowly when placed back on 2L O2. Pt able to walk additional 20 ft back to room. Pt continues with generalized weakness, L hip pain and weakness, impaired balance, gait abnormalities, decreased cardiopulmonary endurance. Will progress as tolerated. ?  ?Recommendations for follow up therapy are one component of a multi-disciplinary discharge planning process, led by the attending physician.  Recommendations may be updated based on patient status, additional functional criteria and insurance authorization. ? ?Follow Up Recommendations ? Follow physician's recommendations for discharge plan and follow up therapies ?  ?  ?Assistance Recommended at Discharge PRN  ?Patient can return home with the following A little help with walking and/or transfers;A little help with bathing/dressing/bathroom;Assistance with cooking/housework;Assist for transportation;Help with stairs or ramp for entrance ?  ?Equipment Recommendations ? Rolling walker (2 wheels);BSC/3in1  ?  ?Recommendations for Other Services   ? ? ?  ?Precautions / Restrictions Precautions ?Precautions: Fall;Other (comment) ?Precaution Comments: watch BP, O2 ?Restrictions ?Weight Bearing Restrictions: Yes ?LLE Weight Bearing: Weight bearing as tolerated  ?  ? ?Mobility ? Bed Mobility ?  ?  ?  ?  ?  ?  ?  ?General bed mobility comments: OOB in chair ?  ? ?Transfers ?Overall transfer level: Needs assistance ?Equipment used: Rolling walker (2 wheels) ?Transfers: Sit to/from  Stand ?Sit to Stand: Min assist, Mod assist ?  ?  ?  ?  ?  ?General transfer comment: Pt requiring minA to stand from recliner, cues for rocking to gain momentum and "nose over toes." ModA to rise on second attempt after seated rest break ?  ? ?Ambulation/Gait ?Ambulation/Gait assistance: Min guard ?Gait Distance (Feet): 50 Feet (30 then 20) ?Assistive device: Rolling walker (2 wheels) ?Gait Pattern/deviations: Step-to pattern, Antalgic, Decreased stance time - left, Decreased weight shift to left ?Gait velocity: decreased ?  ?  ?General Gait Details: Cues for neutral L foot positioning, smaller step, sequencing/technique. Fatigues easily ? ? ?Stairs ?  ?  ?  ?  ?  ? ? ?Wheelchair Mobility ?  ? ?Modified Rankin (Stroke Patients Only) ?  ? ? ?  ?Balance Overall balance assessment: Needs assistance ?Sitting-balance support: Feet supported ?Sitting balance-Leahy Scale: Fair ?  ?  ?Standing balance support: Bilateral upper extremity supported, Reliant on assistive device for balance ?Standing balance-Leahy Scale: Poor ?Standing balance comment: reliant on RW support ?  ?  ?  ?  ?  ?  ?  ?  ?  ?  ?  ?  ? ?  ?Cognition Arousal/Alertness: Awake/alert ?Behavior During Therapy: [facility] for tasks assessed/performed ?Overall Cognitive Status: Within Functional Limits for tasks assessed ?  ?  ?  ?  ?  ?  ?  ?  ?  ?  ?  ?  ?  ?  ?  ?  ?  ?  ?  ? ?  ?Exercises General Exercises - Lower Extremity ?Long Arc Quad: AAROM, 10 reps, Seated, Both, AROM ? ?  ?General Comments   ?  ?  ? ?Pertinent Vitals/Pain  Pain Assessment ?Pain Assessment: Faces ?Faces Pain Scale: Hurts whole lot ?Pain Location: L hip ?Pain Descriptors / Indicators: Grimacing, Operative site guarding ?Pain Intervention(s): Limited activity within patient's tolerance, Monitored during session  ? ? ?Home Living   ?  ?  ?  ?  ?  ?  ?  ?  ?  ?   ?  ?Prior Function    ?  ?  ?   ? ?PT Goals (current goals can now be found in the care plan section) Acute Rehab PT Goals ?Patient  Stated Goal: less nausea ?Potential to Achieve Goals: Good ?Progress towards PT goals: Progressing toward goals ? ?  ?Frequency ? ? ? 7X/week ? ? ? ?  ?PT Plan Current plan remains appropriate  ? ? ?Co-evaluation   ?  ?  ?  ?  ? ?  ?AM-PAC PT "6 Clicks" Mobility   ?Outcome Measure ? Help needed turning from your back to your side while in a flat bed without using bedrails?: A Little ?Help needed moving from lying on your back to sitting on the side of a flat bed without using bedrails?: A Little ?Help needed moving to and from a bed to a chair (including a wheelchair)?: A Little ?Help needed standing up from a chair using your arms (e.g., wheelchair or bedside chair)?: A Lot ?Help needed to walk in hospital room?: A Little ?Help needed climbing 3-5 steps with a railing? : A Lot ?6 Click Score: 16 ? ?  ?End of Session Equipment Utilized During Treatment: Gait belt ?Activity Tolerance: Patient limited by pain;Other (comment) (dizziness/nausea) ?Patient left: with call bell/phone within reach;in chair ?Nurse Communication: Mobility status ?PT Visit Diagnosis: Unsteadiness on feet (R26.81);Other abnormalities of gait and mobility (R26.89);Difficulty in walking, not elsewhere classified (R26.2);Pain ?Pain - Right/Left: Left ?Pain - part of body: Hip ?  ? ? ?Time: 8916-9450 ?PT Time Calculation (min) (ACUTE ONLY): 33 min ? ?Charges:  $Gait Training: 8-22 mins ?$Therapeutic Activity: 8-22 mins          ?          ? ?Wyona Almas, PT, DPT ?Acute Rehabilitation Services ?Pager [phone] ?Office [phone] ? ? ? ?Deno Etienne ?04/03/2022, 11:09 AM ? ?

## 2022-04-03 NOTE — Progress Notes (Signed)
Occupational Therapy Treatment ?Patient Details ?Name: Meghan Miller ?MRN: [id number] ?DOB: June 13, 1936 ?Today's Date: 04/03/2022 ? ? ?History of present illness Pt is a 86 y.o. F who presents s/p L THA, direct anterior 04/01/2022. Significant PMH: skin CA, PVD, bilateral TKA's. ?  ?OT comments ? Pt making slow progress towards goals, able to complete UB ADLs with min A, max A for LB ADLs during session. Pt min A for bed mobility, primarily for LLE assist to EOB. Pt min guard - min A for sit to stand transfers, benefits from elevated surface. Educated pt on use of 3in1 over commode to use at home as well as safety in shower, pt verbalizes understanding. Pt presenting with impairments listed below, will follow acutely. Continue to recommend HHOT at d/c.  ? ?Recommendations for follow up therapy are one component of a multi-disciplinary discharge planning process, led by the attending physician.  Recommendations may be updated based on patient status, additional functional criteria and insurance authorization. ?   ?Follow Up Recommendations ? Home health OT  ?  ?Assistance Recommended at Discharge Intermittent Supervision/Assistance  ?Patient can return home with the following ? A lot of help with walking and/or transfers;A lot of help with bathing/dressing/bathroom;Assistance with cooking/housework;Help with stairs or ramp for entrance;Assist for transportation ?  ?Equipment Recommendations ? BSC/3in1  ?  ?Recommendations for Other Services   ? ?  ?Precautions / Restrictions Precautions ?Precautions: Fall;Other (comment) ?Precaution Comments: watch BP, O2 ?Restrictions ?Weight Bearing Restrictions: Yes ?LLE Weight Bearing: Weight bearing as tolerated  ? ? ?  ? ?Mobility Bed Mobility ?Overal bed mobility: Needs Assistance ?Bed Mobility: Supine to Sit, Sit to Supine ?  ?  ?Supine to sit: Min assist ?  ?  ?General bed mobility comments: assist for LLE ?  ? ?Transfers ?Overall transfer level: Needs assistance ?Equipment  used: Rolling walker (2 wheels) ?Transfers: Sit to/from Stand ?Sit to Stand: Min guard, Min assist ?  ?  ?  ?  ?  ?General transfer comment: min A from lower surfaces, min guard with bed elevated ?  ?  ?Balance Overall balance assessment: Needs assistance ?Sitting-balance support: Feet supported ?Sitting balance-Leahy Scale: Fair ?  ?  ?Standing balance support: Bilateral upper extremity supported, Reliant on assistive device for balance, During functional activity ?Standing balance-Leahy Scale: Poor ?Standing balance comment: reliant on RW support ?  ?  ?  ?  ?  ?  ?  ?  ?  ?  ?  ?   ? ?ADL either performed or assessed with clinical judgement  ? ?ADL Overall ADL's : Needs assistance/impaired ?  ?  ?  ?  ?  ?  ?  ?  ?Upper Body Dressing : Minimal assistance;Sitting ?Upper Body Dressing Details (indicate cue type and reason): to don gown ?Lower Body Dressing: Maximal assistance;Bed level ?Lower Body Dressing Details (indicate cue type and reason): to don socks ?Toilet Transfer: Min guard;Rolling walker (2 wheels);Comfort height toilet;Ambulation ?Toilet Transfer Details (indicate cue type and reason): simulated in room ?  ?  ?  ?  ?Functional mobility during ADLs: Min guard;Rolling walker (2 wheels);Cueing for sequencing;Cueing for safety ?  ?  ? ?Extremity/Trunk Assessment Upper Extremity Assessment ?Upper Extremity Assessment: Generalized weakness ?  ?Lower Extremity Assessment ?Lower Extremity Assessment: Defer to PT evaluation ?  ?  ?  ? ?Vision   ?Vision Assessment?: No apparent visual deficits ?  ?Perception Perception ?Perception: Not tested ?  ?Praxis Praxis ?Praxis: Not tested ?  ? ?Cognition Arousal/Alertness: Awake/alert ?Behavior  During Therapy: [facility] for tasks assessed/performed ?Overall Cognitive Status: Within Functional Limits for tasks assessed ?  ?  ?  ?  ?  ?  ?  ?  ?  ?  ?  ?  ?  ?  ?  ?  ?  ?  ?  ?   ?Exercises   ? ?  ?Shoulder Instructions   ? ? ?  ?General Comments VSS on 2L O2  ? ? ?Pertinent  Vitals/ Pain       Pain Assessment ?Pain Assessment: Faces ?Pain Score: 6  ?Faces Pain Scale: Hurts even more ?Pain Location: L hip ?Pain Descriptors / Indicators: Grimacing, Operative site guarding ?Pain Intervention(s): Limited activity within patient's tolerance, Monitored during session, Repositioned ? ?Home Living   ?  ?  ?  ?  ?  ?  ?  ?  ?  ?  ?  ?  ?  ?  ?  ?  ?  ?  ? ?  ?Prior Functioning/Environment    ?  ?  ?  ?   ? ?Frequency ? Min 3X/week  ? ? ? ? ?  ?Progress Toward Goals ? ?OT Goals(current goals can now be found in the care plan section) ? Progress towards OT goals: Progressing toward goals ? ?Acute Rehab OT Goals ?Patient Stated Goal: none stated ?OT Goal Formulation: With patient ?Time For Goal Achievement: 04/16/22 ?Potential to Achieve Goals: Good ?ADL Goals ?Pt Will Perform Upper Body Dressing: with supervision;sitting ?Pt Will Perform Lower Body Dressing: with mod assist;sitting/lateral leans;with adaptive equipment ?Pt Will Transfer to Toilet: with supervision;ambulating;regular height toilet ?Pt Will Perform Tub/Shower Transfer: Tub transfer;Shower transfer;with min assist;rolling walker;ambulating  ?Plan Discharge plan remains appropriate;Frequency remains appropriate   ? ?Co-evaluation ? ? ?   ?  ?  ?  ?  ? ?  ?AM-PAC OT "6 Clicks" Daily Activity     ?Outcome Measure ? ? Help from another person eating meals?: None ?Help from another person taking care of personal grooming?: A Little ?Help from another person toileting, which includes using toliet, bedpan, or urinal?: A Lot ?Help from another person bathing (including washing, rinsing, drying)?: A Lot ?Help from another person to put on and taking off regular upper body clothing?: A Little ?Help from another person to put on and taking off regular lower body clothing?: A Lot ?6 Click Score: 16 ? ?  ?End of Session Equipment Utilized During Treatment: Gait belt;Rolling walker (2 wheels) ? ?OT Visit Diagnosis: Unsteadiness on feet  (R26.81);Other abnormalities of gait and mobility (R26.89);Muscle weakness (generalized) (M62.81);Pain ?Pain - Right/Left: Left ?Pain - part of body: Hip ?  ?Activity Tolerance Patient tolerated treatment well;Patient limited by fatigue;Patient limited by pain ?  ?Patient Left in chair;with call bell/phone within reach;with chair alarm set ?  ?Nurse Communication Mobility status ?  ? ?   ? ?Time: 5631-4970 ?OT Time Calculation (min): 26 min ? ?Charges: OT General Charges ?$OT Visit: 1 Visit ?OT Treatments ?$Self Care/Home Management : 23-37 mins ? ?Lynnda Child, OTD, OTR/L ?Acute Rehab ?[phone] ? ? ?Kaylyn Lim ?04/03/2022, 11:46 AM ?

## 2022-04-04 ENCOUNTER — Inpatient Hospital Stay (HOSPITAL_COMMUNITY): Payer: Medicare Other

## 2022-04-04 DIAGNOSIS — J9601 Acute respiratory failure with hypoxia: Secondary | ICD-10-CM | POA: Diagnosis not present

## 2022-04-04 DIAGNOSIS — R7989 Other specified abnormal findings of blood chemistry: Secondary | ICD-10-CM

## 2022-04-04 DIAGNOSIS — M79605 Pain in left leg: Secondary | ICD-10-CM

## 2022-04-04 DIAGNOSIS — E66812 Obesity, class 2: Secondary | ICD-10-CM

## 2022-04-04 DIAGNOSIS — M79604 Pain in right leg: Secondary | ICD-10-CM

## 2022-04-04 DIAGNOSIS — D72825 Bandemia: Secondary | ICD-10-CM

## 2022-04-04 DIAGNOSIS — M1612 Unilateral primary osteoarthritis, left hip: Secondary | ICD-10-CM | POA: Diagnosis not present

## 2022-04-04 DIAGNOSIS — D62 Acute posthemorrhagic anemia: Secondary | ICD-10-CM | POA: Diagnosis not present

## 2022-04-04 DIAGNOSIS — M7989 Other specified soft tissue disorders: Secondary | ICD-10-CM

## 2022-04-04 DIAGNOSIS — Z7189 Other specified counseling: Secondary | ICD-10-CM | POA: Diagnosis not present

## 2022-04-04 DIAGNOSIS — E669 Obesity, unspecified: Secondary | ICD-10-CM

## 2022-04-04 DIAGNOSIS — I10 Essential (primary) hypertension: Secondary | ICD-10-CM

## 2022-04-04 LAB — TYPE AND SCREEN
ABO/RH(D): A NEG
Antibody Screen: NEGATIVE
Unit division: 0
Unit division: 0

## 2022-04-04 LAB — CBC WITH DIFFERENTIAL/PLATELET
Abs Immature Granulocytes: 0.14 10*3/uL — ABNORMAL HIGH (ref 0.00–0.07)
Basophils Absolute: 0 10*3/uL (ref 0.0–0.1)
Basophils Relative: 0 %
Eosinophils Absolute: 0.2 10*3/uL (ref 0.0–0.5)
Eosinophils Relative: 2 %
HCT: 31.7 % — ABNORMAL LOW (ref 36.0–46.0)
Hemoglobin: 9.9 g/dL — ABNORMAL LOW (ref 12.0–15.0)
Immature Granulocytes: 1 %
Lymphocytes Relative: 9 %
Lymphs Abs: 1.1 10*3/uL (ref 0.7–4.0)
MCH: 25.6 pg — ABNORMAL LOW (ref 26.0–34.0)
MCHC: 31.2 g/dL (ref 30.0–36.0)
MCV: 82.1 fL (ref 80.0–100.0)
Monocytes Absolute: 1 10*3/uL (ref 0.1–1.0)
Monocytes Relative: 8 %
Neutro Abs: 9.8 10*3/uL — ABNORMAL HIGH (ref 1.7–7.7)
Neutrophils Relative %: 80 %
Platelets: 192 10*3/uL (ref 150–400)
RBC: 3.86 MIL/uL — ABNORMAL LOW (ref 3.87–5.11)
RDW: 16.4 % — ABNORMAL HIGH (ref 11.5–15.5)
WBC: 12.3 10*3/uL — ABNORMAL HIGH (ref 4.0–10.5)
nRBC: 0 % (ref 0.0–0.2)

## 2022-04-04 LAB — URINE CULTURE: Culture: NO GROWTH

## 2022-04-04 LAB — BASIC METABOLIC PANEL
Anion gap: 4 — ABNORMAL LOW (ref 5–15)
BUN: 20 mg/dL (ref 8–23)
CO2: 31 mmol/L (ref 22–32)
Calcium: 8.7 mg/dL — ABNORMAL LOW (ref 8.9–10.3)
Chloride: 103 mmol/L (ref 98–111)
Creatinine, Ser: 0.89 mg/dL (ref 0.44–1.00)
GFR, Estimated: >60 mL/min (ref >60)
Glucose, Bld: 144 mg/dL — ABNORMAL HIGH (ref 70–99)
Potassium: 3.6 mmol/L (ref 3.5–5.1)
Sodium: 138 mmol/L (ref 135–145)

## 2022-04-04 LAB — BPAM RBC
Blood Product Expiration Date: 202304102359
Blood Product Expiration Date: 202304192359
ISSUE DATE / TIME: 202304051350
ISSUE DATE / TIME: 202304051757
Unit Type and Rh: 600
Unit Type and Rh: 600

## 2022-04-04 LAB — BRAIN NATRIURETIC PEPTIDE: B Natriuretic Peptide: 251.5 pg/mL — ABNORMAL HIGH (ref 0.0–100.0)

## 2022-04-04 LAB — PHOSPHORUS: Phosphorus: 2.3 mg/dL — ABNORMAL LOW (ref 2.5–4.6)

## 2022-04-04 LAB — MAGNESIUM: Magnesium: 1.8 mg/dL (ref 1.7–2.4)

## 2022-04-04 MED ORDER — HYDROCODONE-ACETAMINOPHEN 5-325 MG PO TABS
1.0000 | ORAL_TABLET | Freq: Four times a day (QID) | ORAL | Status: DC | PRN
  Administered 2022-04-06 – 2022-04-07 (×2): 1 via ORAL
  Filled 2022-04-04 (×2): qty 1

## 2022-04-04 MED ORDER — HYDROCODONE-ACETAMINOPHEN 5-325 MG PO TABS: 1.0000 | ORAL_TABLET | Freq: Four times a day (QID) | ORAL | 0 refills | Status: DC | PRN

## 2022-04-04 MED ORDER — HYDRALAZINE HCL 25 MG PO TABS: 25.0000 mg | ORAL_TABLET | Freq: Four times a day (QID) | ORAL | Status: DC | PRN

## 2022-04-04 NOTE — Progress Notes (Signed)
Physical Therapy Treatment ?Patient Details ?Name: Meghan Miller ?MRN: [id number] ?DOB: May 29, 1936 ?Today's Date: 04/04/2022 ? ? ?History of Present Illness Pt is a 86 y.o. F who presents s/p L THA, direct anterior 04/01/2022. Significant PMH: skin CA, PVD, bilateral TKA's. ? ?  ?PT Comments  ? ? Pt received seated EOB and agreeable to session with steady progress towards goals. Pt requiring min assist to stand from EOB with cues for rocking for momentum and "nose over toes". Pt grossly min guard for ambulation with assist needed for cues for posture and LE sequencing. Pt continues to fatigue quickly with slow progression of increased endurance. Pt continues to benefit from skilled PT services to progress toward functional mobility goals.  ? ?O2 sats: 97% on RA at rest at start of session seated EOB, dropping to 88% during ambulation on RA, cues for PLB and upright posture, riebounding to 94% on RA with standing recovery break within 5-10 seconds, 100% back on 3L at EOS seated EOB. . ?  ?Recommendations for follow up therapy are one component of a multi-disciplinary discharge planning process, led by the attending physician.  Recommendations may be updated based on patient status, additional functional criteria and insurance authorization. ? ?Follow Up Recommendations ? Follow physician's recommendations for discharge plan and follow up therapies ?  ?  ?Assistance Recommended at Discharge PRN  ?Patient can return home with the following A little help with walking and/or transfers;A little help with bathing/dressing/bathroom;Assistance with cooking/housework;Assist for transportation;Help with stairs or ramp for entrance ?  ?Equipment Recommendations ? Rolling walker (2 wheels);BSC/3in1  ?  ?Recommendations for Other Services   ? ? ?  ?Precautions / Restrictions Precautions ?Precautions: Fall;Other (comment) ?Precaution Comments: watch BP, O2 ?Restrictions ?Weight Bearing Restrictions: Yes ?LLE Weight Bearing: Weight  bearing as tolerated  ?  ? ?Mobility ? Bed Mobility ?Overal bed mobility: Needs Assistance ?Bed Mobility: Sit to Supine ?  ?  ?  ?Sit to supine: Mod assist ?  ?General bed mobility comments: assist for BLE's ?  ? ?Transfers ?Overall transfer level: Needs assistance ?Equipment used: Rolling walker (2 wheels) ?Transfers: Sit to/from Stand ?Sit to Stand: Min guard, Min assist ?  ?  ?  ?  ?  ?General transfer comment: min a to power up ?  ? ?Ambulation/Gait ?Ambulation/Gait assistance: Min guard ?Gait Distance (Feet): 40 Feet ?Assistive device: Rolling walker (2 wheels) ?Gait Pattern/deviations: Step-to pattern, Antalgic, Decreased stance time - left, Decreased weight shift to left ?Gait velocity: decreased ?  ?  ?General Gait Details: very slow gait withcues for smaller step, sequencing/technique. Fatigues easily, cues throughtout for PLB ? ? ?Stairs ?  ?  ?  ?  ?  ? ? ?Wheelchair Mobility ?  ? ?Modified Rankin (Stroke Patients Only) ?  ? ? ?  ?Balance Overall balance assessment: Needs assistance ?Sitting-balance support: Feet supported ?Sitting balance-Leahy Scale: Fair ?  ?  ?Standing balance support: Bilateral upper extremity supported, Reliant on assistive device for balance, During functional activity ?Standing balance-Leahy Scale: Poor ?Standing balance comment: reliant on RW support ?  ?  ?  ?  ?  ?  ?  ?  ?  ?  ?  ?  ? ?  ?Cognition Arousal/Alertness: Awake/alert ?Behavior During Therapy: [facility] for tasks assessed/performed ?Overall Cognitive Status: Within Functional Limits for tasks assessed ?  ?  ?  ?  ?  ?  ?  ?  ?  ?  ?  ?  ?  ?  ?  ?  ?  ?  ?  ? ?  ?  Exercises   ? ?  ?General Comments General comments (skin integrity, edema, etc.): O2 sats 97% on RA at rest, dropping to 88% during ambulation on RA, cues for PLB and upright posture, rising to 94% on RA within 5-10 seconds, 100% back on 3L at EOS. ?  ?  ? ?Pertinent Vitals/Pain Pain Assessment ?Pain Assessment: Faces ?Faces Pain Scale: Hurts little more ?Pain  Location: L hip ?Pain Descriptors / Indicators: Grimacing, Operative site guarding ?Pain Intervention(s): Limited activity within patient's tolerance, Monitored during session, Repositioned  ? ? ?Home Living   ?  ?  ?  ?  ?  ?  ?  ?  ?  ?   ?  ?Prior Function    ?  ?  ?   ? ?PT Goals (current goals can now be found in the care plan section) Acute Rehab PT Goals ?Patient Stated Goal: less nausea ?PT Goal Formulation: With patient ?Time For Goal Achievement: 04/16/22 ? ?  ?Frequency ? ? ? 7X/week ? ? ? ?  ?PT Plan Current plan remains appropriate  ? ? ?Co-evaluation   ?  ?  ?  ?  ? ?  ?AM-PAC PT "6 Clicks" Mobility   ?Outcome Measure ? Help needed turning from your back to your side while in a flat bed without using bedrails?: A Little ?Help needed moving from lying on your back to sitting on the side of a flat bed without using bedrails?: A Little ?Help needed moving to and from a bed to a chair (including a wheelchair)?: A Little ?Help needed standing up from a chair using your arms (e.g., wheelchair or bedside chair)?: A Little ?Help needed to walk in hospital room?: A Little ?Help needed climbing 3-5 steps with a railing? : A Lot ?6 Click Score: 17 ? ?  ?End of Session Equipment Utilized During Treatment: Gait belt ?Activity Tolerance: Patient tolerated treatment well;Patient limited by pain ?Patient left: with call bell/phone within reach;in bed ?Nurse Communication: Mobility status ?PT Visit Diagnosis: Unsteadiness on feet (R26.81);Other abnormalities of gait and mobility (R26.89);Difficulty in walking, not elsewhere classified (R26.2);Pain ?Pain - Right/Left: Left ?Pain - part of body: Hip ?  ? ? ?Time: 1941-7408 ?PT Time Calculation (min) (ACUTE ONLY): 35 min ? ?Charges:  $Gait Training: 8-22 mins ?$Therapeutic Activity: 8-22 mins  ?         ?          ?Audry Riles. PTA ?Acute Rehabilitation Services ?Office: [phone] ? ? ? ?Betsey Holiday  ?04/04/2022, 10:43 AM ? ?

## 2022-04-04 NOTE — Assessment & Plan Note (Signed)
Discussed CODE STATUS including pros and cons of CPR and intubation.  Patient stated that she is DNR/DNI. ?-CODE STATUS changed to DNR/DNI ?

## 2022-04-04 NOTE — Progress Notes (Signed)
Patient pluse ox machine was alarming. RN walked into room, Patient took pulse ox off of her ear and states it was bothering her ears.Patient was placed on room air stating 93-96%before RN came into room.  ? ?New pulse ox placed on patients finger. Oxygen stats in the 70's. Good pleth wave. Fingers slightly cool to touch. RN had patient use Incentive spirometer- Achieved 748m. Oxygen stats low 80's. Patient asymp. Just complains about how cold it is in her room. Warm blankets placed on patient.  ? ?2L O2, placed on patient, oxygen states reaching 86-88%. Oxygen Pumped to 3L patient stating >91%. Will try to wean down as appropriate.  ?

## 2022-04-04 NOTE — Progress Notes (Signed)
Patient was sleeping when RN went to assess VS routinely. RN noted patient's oxygen was not in her nose. Patient's Spo2 49% on RA. Recovered on 3L to 92% within 2-3 minutes. Patient re-educated on the importance of keeping oxygen on. No signs of distress noted when Spo2 49%, patient not showing signs of dyspnea or confusion.  ? ?

## 2022-04-04 NOTE — Assessment & Plan Note (Signed)
S/p left total arthroplasty by Dr. Lorin Mercy.  ?-Management per primary ?

## 2022-04-04 NOTE — Progress Notes (Signed)
Physical Therapy Treatment ?Patient Details ?Name: Meghan Miller ?MRN: [id number] ?DOB: 1936-09-23 ?Today's Date: 04/04/2022 ? ? ?History of Present Illness Pt is a 86 y.o. F who presents s/p L THA, direct anterior 04/01/2022. Significant PMH: skin CA, PVD, bilateral TKA's. ? ?  ?PT Comments  ? ? Pt received OOB up in recliner, stating she has ambulated multiple times since our first session. Pt agreeable to seated therex with good tolerance. Educated and verbally reviewed all standing HEP exercises not performed and reviewed HEP handout with exercises performed. Lengthy discussion with pt during session regarding disposition, pt stating her preference is not SNF and son is on his way. She states he will be getting her home and staying 24/7 with her for 7 days, also stating other son can stay if she needs help longer than 7 days. She states she has a ramp to enter her home and has no stairs. Anticipate safe discharge home with family assistance once medically cleared, will follow acutely. Pt continues to benefit from skilled PT services to progress toward functional mobility goals.  ?  ?Recommendations for follow up therapy are one component of a multi-disciplinary discharge planning process, led by the attending physician.  Recommendations may be updated based on patient status, additional functional criteria and insurance authorization. ? ?Follow Up Recommendations ? Follow physician's recommendations for discharge plan and follow up therapies (lengthy discussion with pt during session, pt stating son is on his way and he will be getting her home and staying 24/7 with her for 7 days, and other son can stay if she needs help longer than 7 days. She has no stairs. Anticipate safe d/c home.) ?  ?  ?Assistance Recommended at Discharge PRN  ?Patient can return home with the following A little help with walking and/or transfers;A little help with bathing/dressing/bathroom;Assistance with cooking/housework;Assist for  transportation;Help with stairs or ramp for entrance ?  ?Equipment Recommendations ? Rolling walker (2 wheels);BSC/3in1  ?  ?Recommendations for Other Services   ? ? ?  ?Precautions / Restrictions Precautions ?Precautions: Fall;Other (comment) ?Precaution Comments: watch BP, O2 ?Restrictions ?Weight Bearing Restrictions: Yes ?LLE Weight Bearing: Weight bearing as tolerated  ?  ? ?Mobility ? Bed Mobility ?Overal bed mobility: Needs Assistance ?Bed Mobility: Sit to Supine ?  ?  ?  ?Sit to supine: Mod assist ?  ?General bed mobility comments: OOB in recliner on arrival ?  ? ?Transfers ?Overall transfer level: Needs assistance ?Equipment used: Rolling walker (2 wheels) ?Transfers: Sit to/from Stand ?Sit to Stand: Min guard, Min assist ?  ?  ?  ?  ?  ?General transfer comment: pt declining stating she just walked around room and to bathroom ?  ? ?Ambulation/Gait ?Ambulation/Gait assistance: Min guard ?Gait Distance (Feet): 40 Feet ?Assistive device: Rolling walker (2 wheels) ?Gait Pattern/deviations: Step-to pattern, Antalgic, Decreased stance time - left, Decreased weight shift to left ?Gait velocity: decreased ?  ?  ?General Gait Details: pt declining stating she just walked around room and to bathroom ? ? ?Stairs ?  ?  ?  ?  ?  ? ? ?Wheelchair Mobility ?  ? ?Modified Rankin (Stroke Patients Only) ?  ? ? ?  ?Balance Overall balance assessment: Needs assistance ?Sitting-balance support: Feet supported ?Sitting balance-Leahy Scale: Fair ?  ?  ?Standing balance support: Bilateral upper extremity supported, Reliant on assistive device for balance, During functional activity ?Standing balance-Leahy Scale: Poor ?Standing balance comment: reliant on RW or sink for balance ?  ?  ?  ?  ?  ?  ?  ?  ?  ?  ?  ?  ? ?  ?  Cognition Arousal/Alertness: Awake/alert ?Behavior During Therapy: [facility] for tasks assessed/performed ?Overall Cognitive Status: Within Functional Limits for tasks assessed ?  ?  ?  ?  ?  ?  ?  ?  ?  ?  ?  ?  ?  ?  ?   ?  ?  ?  ?  ? ?  ?Exercises Total Joint Exercises ?Ankle Circles/Pumps: 10 reps ?Quad Sets: 10 reps ?Heel Slides: 10 reps ?Hip ABduction/ADduction: 10 reps ?Straight Leg Raises: 5 reps ? ?  ?General Comments General comments (skin integrity, edema, etc.): VSS on 3L ?  ?  ? ?Pertinent Vitals/Pain Pain Assessment ?Pain Assessment: Faces ?Faces Pain Scale: Hurts a little bit ?Pain Location: L Hip with mobility ?Pain Descriptors / Indicators: Grimacing, Operative site guarding ?Pain Intervention(s): Limited activity within patient's tolerance, Monitored during session  ? ? ?Home Living   ?  ?  ?  ?  ?  ?  ?  ?  ?  ?   ?  ?Prior Function    ?  ?  ?   ? ?PT Goals (current goals can now be found in the care plan section) Acute Rehab PT Goals ?PT Goal Formulation: With patient ?Time For Goal Achievement: 04/16/22 ? ?  ?Frequency ? ? ? 7X/week ? ? ? ?  ?PT Plan Current plan remains appropriate  ? ? ?Co-evaluation   ?  ?  ?  ?  ? ?  ?AM-PAC PT "6 Clicks" Mobility   ?Outcome Measure ? Help needed turning from your back to your side while in a flat bed without using bedrails?: A Little ?Help needed moving from lying on your back to sitting on the side of a flat bed without using bedrails?: A Little ?Help needed moving to and from a bed to a chair (including a wheelchair)?: A Little ?Help needed standing up from a chair using your arms (e.g., wheelchair or bedside chair)?: A Little ?Help needed to walk in hospital room?: A Little ?Help needed climbing 3-5 steps with a railing? : A Lot ?6 Click Score: 17 ? ?  ?End of Session   ?Activity Tolerance: Patient tolerated treatment well;Patient limited by pain ?Patient left: with call bell/phone within reach;in chair ?Nurse Communication: Mobility status ?PT Visit Diagnosis: Unsteadiness on feet (R26.81);Other abnormalities of gait and mobility (R26.89);Difficulty in walking, not elsewhere classified (R26.2);Pain ?Pain - Right/Left: Left ?Pain - part of body: Hip ?  ? ? ?Time:  1350-1407 ?PT Time Calculation (min) (ACUTE ONLY): 17 min ? ?Charges:  $Therapeutic Exercise: 8-22 mins          ?          ? ?Audry Riles. PTA ?Acute Rehabilitation Services ?Office: [phone] ? ? ? ?Betsey Holiday  ?04/04/2022, 2:20 PM ? ?

## 2022-04-04 NOTE — Assessment & Plan Note (Signed)
Likely demargination versus infection. ?-Continue monitoring ?

## 2022-04-04 NOTE — Progress Notes (Signed)
Patient ID: Meghan Miller, female   DOB: 06-Apr-1936, 86 y.o.   MRN: [id number] ? ? ?I spoke with hospitalist and reviewed his note.  Greatly appreciate the consult and help.  I anticipate patient may be at the hospital through the weekend.  Hospitalist team will follow along.  We will see if she progresses with weaning off O2 nasal cannula and physical therapy. ? ?

## 2022-04-04 NOTE — Assessment & Plan Note (Signed)
This is unreliable postop.  Patient is already on full dose aspirin for VTE prophylaxis.  She has no calf tenderness ?-No further work-up ?

## 2022-04-04 NOTE — Progress Notes (Signed)
NCM spoke with pt's son Meghan Miller to confirm disposition plan for home. Meghan Miller states he arrived to [facility] [place] today from [place] and plans to be here for 2 weeks to assist and care for his mom once discharged. ?Dariya Gainer (Son)     ?[phone] ?    ?Whitman Hero RN,BSN,CM ? ? ? ?

## 2022-04-04 NOTE — Consult Note (Signed)
Initial Consultation Note ? ? ?Patient: Meghan Miller XIP:[id number] DOB: 28-Aug-1936 PCP: Neale Burly, MD ?DOA: 04/01/2022 ?DOS: the patient was seen and examined on 04/04/2022 ?Primary service: [name], MD ? ?Referring physician: Benjiman Core, PA. Rodell Perna, MD ?Reason for consult: Respiratory failure ? ?Assessment and Plan: ?* Primary osteoarthritis of left hip ?S/p left total arthroplasty by Dr. Lorin Mercy.  ?-Management per primary ? ?Acute respiratory failure with hypoxia ([place]) ?Likely due to anemia and atelectasis.  No history of COPD or CHF but she has 25-pack-year history and quit smoking in 1991.  She has elevated D-dimer but low suspicion for VTE.  I do not think a D-dimer is reliable postsurgery. Surprisingly, she desaturated at rest although she maintained appropriate saturation with ambulation.  She has crackles, right> left. She has no significant respiratory distress.  No work of breathing or wheeze.  About 750 cc on incentive spirometry.  She has mild BLE edema unchanged from baseline.  Otherwise, she appears euvolemic. ?-Check BNP and CXR ?-Wean off oxygen.  Minimum oxygen to keep saturation above 90%. ?-Encourage incentive spirometry/OOB/PT/OT ?-Suggest discontinuing home Coreg if BP controlled with other antihypertensive meds. ?-Trial of DuoNebs as needed. ?-Treat anemia ? ?Goals of care, counseling/discussion ?Discussed CODE STATUS including pros and cons of CPR and intubation.  Patient stated that she is DNR/DNI. ?-CODE STATUS changed to DNR/DNI ? ?Acute postoperative anemia due to expected blood loss ?Recent Labs  ?  01/10/22 ?1347 03/25/22 ?1350 04/02/22 ?0148 04/03/22 ?0325 04/04/22 ?1412  ?HGB 11.8* 12.3 9.1* 8.3* 9.9*  ?Patient had an EBL of 800 cc documented.  Denies melena or hematochezia.  Transfused 1 unit on 4/4 but Hgb dropped further to 8.3.  Transfused another unit today.  Now Hgb up to 9.9.  There could be some element of hemodilution from IV fluids. ?-Discontinue IV  fluid ?-Check anemia panel in the morning ?-Transfuse for Hgb <7.0 or if she is symptomatic. ?-Recheck H&H in the morning ? ? ?Elevated d-dimer ?This is unreliable postop.  Patient is already on full dose aspirin for VTE prophylaxis.  She has no calf tenderness ?-No further work-up ? ?Bandemia ?Likely demargination versus infection. ?-Continue monitoring ? ?Essential hypertension ?Hypotensive with DBP in 50s.  SBP in 110s.  She is on multiple antihypertensive meds ?-Hold home Coreg, amlodipine, Avapro and HCTZ for now ?-I would avoid Coreg if she continues to have respiratory issue ?-We will reintroduce other antihypertensive meds as BP allows.  ? ?Class II obesity ?Body mass index is 38.11 kg/m?. ?Encourage lifestyle change to lose weight. ? ? ? ? ? ? ?TRH will continue to follow the patient. ? ?HPI: Meghan Miller  ?86 year old F with history hip arthritis, HTN, dyspnea, former smoker and obesity admitted by orthopedic surgery team for chronic hip arthritis.  She underwent left total arthroplasty on 04/01/2022.  She had postoperative blood loss anemia with hemoglobin dropping from 12-9.1.  EBL 800 cc.  She had 1 unit on 4/5 but hemoglobin trended down to 8.3 this morning.  She had another 1 unit.  She was on 2 different IV fluids.  She was also requiring supplemental oxygen.  She was ambulated this afternoon and maintain appropriate saturation but she desaturated to upper 80s after she sat down and was not able to recover above 88%.  Hospitalist service consulted due to acute respiratory failure with hypoxia. ? ?Patient denies shortness of breath and chest pain.  She reports chronic cough unchanged from baseline.  Cough is sometimes productive with clear  phlegm.  She denies hemoptysis.  She denies dizziness but felt weak when she walked with therapy in the room.  She attributes this to her hip pain.  She denies palpitation, GI or UTI symptoms.  She says she was recently treated for UTI.  She has some swelling in  her legs unchanged from baseline.  She denies melena or hematochezia.  She denies history of COPD, heart failure or blood clot. ? ?She lives alone.  Started using cane lately due to hip pain.  She has 25-pack-year history and quit in 1991.  She quit drinking as well.  She denies recreational drug use.  She says she is DNR/DNI. ? ?She is a slightly hypotensive with DBP in 50s.  SBP in 110s.  Saturating in mid to upper 90s on 3 L by [place].  CBC from this morning with WBC of 11.0 and Hgb of 8.3.  D-dimer 1.57.  Patient is on multiple antihypertensive meds and IV fluid.  ? ?Review of Systems: As mentioned in the history of present illness. All other systems reviewed and are negative. ?Past Medical History:  ?Diagnosis Date  ? Anemia   ? "i used to have this all the time"  ? Arthritis   ? Cancer [facility])   ? skin cancer- pt has had 2 spots removed from face/ear area (2020 and 2022)  ? Dyspnea   ? Hypertension   ? Peripheral vascular disease ([place])   ? "blockages in my legs"- "valves not good"- causes leg swelling per patient  ? ?Past Surgical History:  ?Procedure Laterality Date  ? ABDOMINAL HYSTERECTOMY    ? APPENDECTOMY    ? appendix removed with tubal ligation  ? bladder prolapse    ? BREAST SURGERY    ? "couple of breast biopsies" on left side per patient  ? CARDIAC CATHETERIZATION    ? between 1985-1995. Pt states she had "some blockage in one artery but there was no concern"  ? CATARACT EXTRACTION Bilateral   ? "years ago"  ? CHOLECYSTECTOMY    ? EYE SURGERY Bilateral 1985  ? cataract extraction  ? JOINT REPLACEMENT    ? both knees replaced in 1991  ? KNEE SURGERY Left 1996  ? revision  ? TONSILLECTOMY    ? removed as a child  ? TOTAL HIP ARTHROPLASTY Left 04/01/2022  ? Procedure: LEFT TOTAL HIP ARTHROPLASTY ANTERIOR APPROACH;  Surgeon: [name], MD;  Location: [facility];  Service: Orthopedics;  Laterality: Left;  ? TUBAL LIGATION  1970  ? ?Social History:  reports that she quit smoking about 34 years ago. Her smoking use  included cigarettes. She has never used smokeless tobacco. She reports current alcohol use. She reports that she does not use drugs. ? ?Allergies  ?Allergen Reactions  ? Penicillins Hives, Shortness Of Breath and Rash  ? ? ?History reviewed. No pertinent family history. ? ?Prior to Admission medications   ?Medication Sig Start Date End Date Taking? Authorizing Provider  ?acetaminophen (TYLENOL) 500 MG tablet Take 500 mg by mouth every 6 (six) hours as needed for moderate pain.   Yes [provider]  ?amLODipine (NORVASC) 5 MG tablet Take 5 mg by mouth daily. 01/30/22  Yes [provider]  ?betamethasone, augmented, (DIPROLENE) 0.05 % lotion Apply 1 application topically daily as needed for itching. 12/28/21  Yes [provider]  ?Cholecalciferol (VITAMIN D) 125 MCG (5000 UT) CAPS Take 5,000 Units by mouth daily.   Yes [provider]  ?docusate sodium (COLACE) 100  MG capsule Take 100 mg by mouth daily as needed for moderate constipation.   Yes [provider]  ?fesoterodine (TOVIAZ) 4 MG TB24 tablet Take 4 mg by mouth daily. 02/02/22  Yes [provider]  ?fluticasone (FLONASE) 50 MCG/ACT nasal spray Place 1 spray into both nostrils daily.   Yes [provider]  ?guaiFENesin (MUCINEX) 600 MG 12 hr tablet Take 600 mg by mouth 2 (two) times daily as needed for cough or to loosen phlegm.   Yes [provider]  ?HYDROcodone-acetaminophen (NORCO) 7.5-325 MG tablet Take 1 tablet by mouth every 6 (six) hours as needed for moderate pain. 04/02/22  Yes Lanae Crumbly, PA-C  ?naproxen sodium (ALEVE) 220 MG tablet Take 220 mg by mouth in the morning and at bedtime.   Yes [provider]  ?olmesartan-hydrochlorothiazide (BENICAR HCT) 40-25 MG tablet Take 1 tablet by mouth daily. 03/17/22  Yes [provider]  ?OVER THE COUNTER MEDICATION Take 2 tablets by mouth daily as needed (pain). CBD Gummies   Yes [provider]  ?vitamin B-12  (CYANOCOBALAMIN) 500 MCG tablet Take 500 mcg by mouth daily.   Yes [provider]  ?aspirin EC 325 MG EC tablet Take 1 tablet (325 mg total) by mouth daily with breakfast. MUST TAKE AT LEAST 4 WEEKS POSTOP FO

## 2022-04-04 NOTE — Progress Notes (Addendum)
Subjective: ?Patient c/o being "tired". Slow progress with PT.  Still requiring O2 [place] due to sats dropping when up with therapy.   Venous doppler today negative for DVT.   ? ?Objective: ?Vital signs in last 24 hours: ?Temp:  [98.3 ?F (36.8 ?C)-98.7 ?F (37.1 ?C)] 98.5 ?F (36.9 ?C) (04/06 8676) ?Pulse Rate:  [78-98] 86 (04/06 0807) ?Resp:  [18-20] 18 (04/06 0807) ?BP: (100-132)/(45-69) 113/52 (04/06 7209) ?SpO2:  [92 %-100 %] 95 % (04/06 0807) ? ?Intake/Output from previous day: ?04/05 0701 - 04/06 0700 ?In: 3406.9 [I.V.:2656.9; Blood:750] ?Out: 1100 [Urine:1100] ?Intake/Output this shift: ?Total I/O ?In: -  ?Out: 300 [Urine:300] ? ?Recent Labs  ?  04/02/22 ?0148 04/03/22 ?0325  ?HGB 9.1* 8.3*  ? ?Recent Labs  ?  04/02/22 ?0148 04/03/22 ?0325  ?WBC 14.0* 11.0*  ?RBC 3.69* 3.47*  ?HCT 29.3* 28.1*  ?PLT 229 189  ? ?Recent Labs  ?  04/02/22 ?0148  ?NA 138  ?K 4.0  ?CL 102  ?CO2 30  ?BUN 14  ?CREATININE 0.75  ?GLUCOSE 138*  ?CALCIUM 9.1  ? ?No results for input(s): LABPT, INR in the last 72 hours. ? ?Exam: ?Pleasant female, alert and oriented, NAD.  Wound looks good.  Staples intact.  No drainage or signs of infection.   ? ? ? ?Assessment/Plan: ?-Change pain med to norco 5/325.   ?-Slow progress.  I advised patient we may need to consider transfer to SNF.  Will see how she does this afternoon.   ?-recheck CBC.  Had transfusion PRBC's yesterday.  ? ? ? ? ?Benjiman Core ?04/04/2022, 12:48 PM  ? ? ? ? ?

## 2022-04-04 NOTE — Hospital Course (Addendum)
86 year old F with history hip arthritis, HTN, dyspnea, former smoker and obesity admitted by orthopedic surgery team for chronic hip arthritis.  She underwent left total arthroplasty on 04/01/2022.  She had postoperative blood loss anemia with hemoglobin dropping from 12-9.1.  EBL 800 cc.  She had 1 unit on 4/5 but hemoglobin trended down to 8.3 this morning.  She had another 1 unit.  She was on 2 different IV fluids.  She was also requiring supplemental oxygen.  She was ambulated this afternoon and maintain appropriate saturation but she desaturated to upper 80s after she sat down and was not able to recover above 88%.  Hospitalist service consulted due to acute respiratory failure with hypoxia. ? ?Patient denies shortness of breath and chest pain.  She reports chronic cough unchanged from baseline.  Cough is sometimes productive with clear phlegm.  She denies hemoptysis.  She denies dizziness but felt weak when she walked with therapy in the room.  She attributes this to her hip pain.  She denies palpitation, GI or UTI symptoms.  She says she was recently treated for UTI.  She has some swelling in her legs unchanged from baseline.  She denies melena or hematochezia.  She denies history of COPD, heart failure or blood clot. ? ?She lives alone.  Started using cane lately due to hip pain.  She has 25-pack-year history and quit in 1991.  She quit drinking as well.  She denies recreational drug use.  She says she is DNR/DNI. ? ?She is a slightly hypotensive with DBP in 50s.  SBP in 110s.  Saturating in mid to upper 90s on 3 L by [place].  CBC from this morning with WBC of 11.0 and Hgb of 8.3.  D-dimer 1.57.  Patient is on multiple antihypertensive meds and IV fluid. ?

## 2022-04-04 NOTE — Progress Notes (Signed)
VASCULAR LAB ? ? ? ?Bilateral lower extremity venous duplex has been performed. ? ?See CV proc for preliminary results. ? ? ?, , RVT ?04/04/2022, 12:07 PM ? ?

## 2022-04-04 NOTE — Plan of Care (Signed)
?  Problem: Clinical Measurements: ?Goal: Ability to maintain clinical measurements within normal limits will improve ?Outcome: Progressing ?Goal: Will remain free from infection ?Outcome: Progressing ?Goal: Diagnostic test results will improve ?Outcome: Progressing ?Goal: Respiratory complications will improve ?Outcome: Progressing ?Goal: Cardiovascular complication will be avoided ?Outcome: Progressing ?  ?Problem: Nutrition: ?Goal: Adequate nutrition will be maintained ?Outcome: Progressing ?  ?Problem: Activity: ?Goal: Risk for activity intolerance will decrease ?Outcome: Progressing ?  ?Problem: Elimination: ?Goal: Will not experience complications related to bowel motility ?Outcome: Progressing ?Goal: Will not experience complications related to urinary retention ?Outcome: Progressing ?  ?Problem: Pain Managment: ?Goal: General experience of comfort will improve ?Outcome: Progressing ?  ?Problem: Safety: ?Goal: Ability to remain free from injury will improve ?Outcome: Progressing ?  ?Problem: Skin Integrity: ?Goal: Risk for impaired skin integrity will decrease ?Outcome: Progressing ?  ?

## 2022-04-04 NOTE — Assessment & Plan Note (Addendum)
Recent Labs  ?  01/10/22 ?1347 03/25/22 ?1350 04/02/22 ?0148 04/03/22 ?0325 04/04/22 ?1412  ?HGB 11.8* 12.3 9.1* 8.3* 9.9*  ?Patient had an EBL of 800 cc documented.  Denies melena or hematochezia.  Transfused 1 unit on 4/4 but Hgb dropped further to 8.3.  Transfused another unit today.  Now Hgb up to 9.9.  There could be some element of hemodilution from IV fluids. ?-Discontinue IV fluid ?-Check anemia panel in the morning ?-Transfuse for Hgb <7.0 or if she is symptomatic. ?-Recheck H&H in the morning ? ?

## 2022-04-04 NOTE — Progress Notes (Signed)
SATURATION QUALIFICATIONS: (This note is used to comply with regulatory documentation for home oxygen) ? ?Patient Saturations on Room Air at Rest = 97% ? ?Patient Saturations on Room Air while Ambulating = 91% was the lowest she dropped to.  ? ?Patient Saturations on 0 Liters of oxygen while Ambulating .  ? ?Patient went from laying to sitting, to ambulating from the bed to the window. Made a u turn and went back to sitting at the edge of bed. Oxygen remained > 91% on room air. Once patient got to bed before sitting down, her oxygen dropped to 87%. Patient sat down. RN gave time to recover. Was unable to recover about 88%.  ?Patient placed back on 2L, oxygen increased to 94%. ? ?Patient had no SOB or chest pain. Was not hunched over, breathing heavily. Patient talked a lot about how hungry she is and she does not want her food to get cold.  ? Ricard Dillon, [place] aware.  ? ?Please briefly explain why patient needs home oxygen: ?

## 2022-04-04 NOTE — Progress Notes (Signed)
Occupational Therapy Treatment ?Patient Details ?Name: Meghan Miller ?MRN: [id number] ?DOB: 07-25-1936 ?Today's Date: 04/04/2022 ? ? ?History of present illness Pt is a 86 y.o. F who presents s/p L THA, direct anterior 04/01/2022. Significant PMH: skin CA, PVD, bilateral TKA's. ?  ?OT comments ? Patient seated on EOB and completing bathing.  Patient assisted with completing donning gown and ambulated to bathroom for grooming as sink with patient being reliant on sink for balance. Patient transferred to toilet with 3n1 over toilet requiring min assist to stand. Patient making progress with OT treatment. Acute OT to continue to follow.   ? ?Recommendations for follow up therapy are one component of a multi-disciplinary discharge planning process, led by the attending physician.  Recommendations may be updated based on patient status, additional functional criteria and insurance authorization. ?   ?Follow Up Recommendations ? Home health OT  ?  ?Assistance Recommended at Discharge Intermittent Supervision/Assistance  ?Patient can return home with the following ? A lot of help with walking and/or transfers;A lot of help with bathing/dressing/bathroom;Assistance with cooking/housework;Help with stairs or ramp for entrance;Assist for transportation ?  ?Equipment Recommendations ? BSC/3in1  ?  ?Recommendations for Other Services   ? ?  ?Precautions / Restrictions Precautions ?Precautions: Fall;Other (comment) ?Precaution Comments: watch BP, O2 ?Restrictions ?Weight Bearing Restrictions: Yes ?LLE Weight Bearing: Weight bearing as tolerated  ? ? ?  ? ?Mobility Bed Mobility ?Overal bed mobility: Needs Assistance ?  ?  ?  ?  ?  ?  ?General bed mobility comments: seated on EOB upon entry ?  ? ?Transfers ?Overall transfer level: Needs assistance ?Equipment used: Rolling walker (2 wheels) ?Transfers: Sit to/from Stand ?Sit to Stand: Min guard, Min assist ?  ?  ?Step pivot transfers: Min guard ?  ?  ?General transfer comment: min  assist to power up from lower surface ?  ?  ?Balance Overall balance assessment: Needs assistance ?Sitting-balance support: Feet supported ?Sitting balance-Leahy Scale: Fair ?  ?  ?Standing balance support: Bilateral upper extremity supported, Reliant on assistive device for balance, During functional activity ?Standing balance-Leahy Scale: Poor ?Standing balance comment: reliant on RW or sink for balance ?  ?  ?  ?  ?  ?  ?  ?  ?  ?  ?  ?   ? ?ADL either performed or assessed with clinical judgement  ? ?ADL Overall ADL's : Needs assistance/impaired ?  ?  ?Grooming: Wash/dry hands;Wash/dry face;Oral care;Min guard;Standing ?Grooming Details (indicate cue type and reason): at sink ?  ?  ?  ?  ?Upper Body Dressing : Standing;Min guard ?Upper Body Dressing Details (indicate cue type and reason): to don gown ?  ?  ?Toilet Transfer: Min guard;Rolling walker (2 wheels);Comfort height toilet;Ambulation;Minimal assistance ?Toilet Transfer Details (indicate cue type and reason): ambulated to bathroom with 3n1 over toilet with min guard, required min assist to stand from toilet ?Toileting- Clothing Manipulation and Hygiene: Supervision/safety;Sitting/lateral lean ?Toileting - Clothing Manipulation Details (indicate cue type and reason): performed toilet hygiene ?  ?  ?  ?General ADL Comments: required verbal cues for encouragement to stand at sink for grooming ?  ? ?Extremity/Trunk Assessment   ?  ?  ?  ?  ?  ? ?Vision   ?  ?  ?Perception   ?  ?Praxis   ?  ? ?Cognition Arousal/Alertness: Awake/alert ?Behavior During Therapy: [facility] for tasks assessed/performed ?Overall Cognitive Status: Within Functional Limits for tasks assessed ?  ?  ?  ?  ?  ?  ?  ?  ?  ?  ?  ?  ?  ?  ?  ?  ?  ?  ?  ?   ?  Exercises   ? ?  ?Shoulder Instructions   ? ? ?  ?General Comments O2 sats 97% on RA at rest, dropping to 88% during ambulation on RA, cues for PLB and upright posture, rising to 94% on RA within 5-10 seconds, 100% back on 3L at EOS.   ? ? ?Pertinent Vitals/ Pain       Pain Assessment ?Pain Assessment: PAINAD ?Faces Pain Scale: Hurts little more ?Pain Location: L Hip with mobility ?Pain Descriptors / Indicators: Grimacing, Operative site guarding ?Pain Intervention(s): Limited activity within patient's tolerance, Monitored during session ? ?Home Living   ?  ?  ?  ?  ?  ?  ?  ?  ?  ?  ?  ?  ?  ?  ?  ?  ?  ?  ? ?  ?Prior Functioning/Environment    ?  ?  ?  ?   ? ?Frequency ? Min 3X/week  ? ? ? ? ?  ?Progress Toward Goals ? ?OT Goals(current goals can now be found in the care plan section) ? Progress towards OT goals: Progressing toward goals ? ?Acute Rehab OT Goals ?Patient Stated Goal: get better ?OT Goal Formulation: With patient ?Time For Goal Achievement: 04/16/22 ?Potential to Achieve Goals: Good ?ADL Goals ?Pt Will Perform Upper Body Dressing: with supervision;sitting ?Pt Will Perform Lower Body Dressing: with mod assist;sitting/lateral leans;with adaptive equipment ?Pt Will Transfer to Toilet: with supervision;ambulating;regular height toilet ?Pt Will Perform Tub/Shower Transfer: Tub transfer;Shower transfer;with min assist;rolling walker;ambulating  ?Plan Discharge plan remains appropriate;Frequency remains appropriate   ? ?Co-evaluation ? ? ?   ?  ?  ?  ?  ? ?  ?AM-PAC OT "6 Clicks" Daily Activity     ?Outcome Measure ? ? Help from another person eating meals?: None ?Help from another person taking care of personal grooming?: A Little ?Help from another person toileting, which includes using toliet, bedpan, or urinal?: A Little ?Help from another person bathing (including washing, rinsing, drying)?: A Lot ?Help from another person to put on and taking off regular upper body clothing?: A Little ?Help from another person to put on and taking off regular lower body clothing?: A Lot ?6 Click Score: 17 ? ?  ?End of Session Equipment Utilized During Treatment: Gait belt;Rolling walker (2 wheels) ? ?OT Visit Diagnosis: Unsteadiness on feet  (R26.81);Other abnormalities of gait and mobility (R26.89);Muscle weakness (generalized) (M62.81);Pain ?Pain - Right/Left: Left ?Pain - part of body: Hip ?  ?Activity Tolerance Patient tolerated treatment well ?  ?Patient Left in chair;with call bell/phone within reach;with chair alarm set ?  ?Nurse Communication Mobility status ?  ? ?   ? ?Time: 2330-0762 ?OT Time Calculation (min): 28 min ? ?Charges: OT General Charges ?$OT Visit: 1 Visit ?OT Treatments ?$Self Care/Home Management : 23-37 mins ? ?Lodema Hong, OTA ?Acute Rehabilitation Services  ?Pager [phone] ?Office [phone] ? ? ?Meghan Miller ?04/04/2022, 12:26 PM ?

## 2022-04-04 NOTE — Assessment & Plan Note (Signed)
Hypotensive with DBP in 50s.  SBP in 110s.  She is on multiple antihypertensive meds ?-Hold home Coreg, amlodipine, Avapro and HCTZ for now ?-I would avoid Coreg if she continues to have respiratory issue ?-We will reintroduce other antihypertensive meds as BP allows.  ?

## 2022-04-04 NOTE — Assessment & Plan Note (Addendum)
Likely due to anemia and atelectasis.  No history of COPD or CHF but she has 25-pack-year history and quit smoking in 1991.  She has elevated D-dimer but low suspicion for VTE.  I do not think a D-dimer is reliable postsurgery. Surprisingly, she desaturated at rest although she maintained appropriate saturation with ambulation.  She has crackles, right> left. She has no significant respiratory distress.  No work of breathing or wheeze.  About 750 cc on incentive spirometry.  She has mild BLE edema unchanged from baseline.  Otherwise, she appears euvolemic. ?-Check BNP and CXR ?-Wean off oxygen.  Minimum oxygen to keep saturation above 90%. ?-Encourage incentive spirometry/OOB/PT/OT ?-Suggest discontinuing home Coreg if BP controlled with other antihypertensive meds. ?-Trial of DuoNebs as needed. ?-Treat anemia ?

## 2022-04-04 NOTE — Assessment & Plan Note (Signed)
Body mass index is 38.11 kg/m?. ?Encourage lifestyle change to lose weight. ?

## 2022-04-05 DIAGNOSIS — M1612 Unilateral primary osteoarthritis, left hip: Secondary | ICD-10-CM | POA: Diagnosis not present

## 2022-04-05 DIAGNOSIS — E669 Obesity, unspecified: Secondary | ICD-10-CM | POA: Diagnosis not present

## 2022-04-05 DIAGNOSIS — J9601 Acute respiratory failure with hypoxia: Secondary | ICD-10-CM | POA: Diagnosis not present

## 2022-04-05 LAB — CBC
HCT: 32 % — ABNORMAL LOW (ref 36.0–46.0)
Hemoglobin: 9.8 g/dL — ABNORMAL LOW (ref 12.0–15.0)
MCH: 25.5 pg — ABNORMAL LOW (ref 26.0–34.0)
MCHC: 30.6 g/dL (ref 30.0–36.0)
MCV: 83.3 fL (ref 80.0–100.0)
Platelets: 203 10*3/uL (ref 150–400)
RBC: 3.84 MIL/uL — ABNORMAL LOW (ref 3.87–5.11)
RDW: 16.5 % — ABNORMAL HIGH (ref 11.5–15.5)
WBC: 12.3 10*3/uL — ABNORMAL HIGH (ref 4.0–10.5)
nRBC: 0 % (ref 0.0–0.2)

## 2022-04-05 LAB — RETICULOCYTES
Immature Retic Fract: 22.4 % — ABNORMAL HIGH (ref 2.3–15.9)
RBC.: 3.87 MIL/uL (ref 3.87–5.11)
Retic Count, Absolute: 61.9 10*3/uL (ref 19.0–186.0)
Retic Ct Pct: 1.6 % (ref 0.4–3.1)

## 2022-04-05 LAB — RENAL FUNCTION PANEL
Albumin: 2.8 g/dL — ABNORMAL LOW (ref 3.5–5.0)
Anion gap: 5 (ref 5–15)
BUN: 18 mg/dL (ref 8–23)
CO2: 32 mmol/L (ref 22–32)
Calcium: 9.1 mg/dL (ref 8.9–10.3)
Chloride: 101 mmol/L (ref 98–111)
Creatinine, Ser: 0.79 mg/dL (ref 0.44–1.00)
GFR, Estimated: >60 mL/min (ref >60)
Glucose, Bld: 129 mg/dL — ABNORMAL HIGH (ref 70–99)
Phosphorus: 2.3 mg/dL — ABNORMAL LOW (ref 2.5–4.6)
Potassium: 3.6 mmol/L (ref 3.5–5.1)
Sodium: 138 mmol/L (ref 135–145)

## 2022-04-05 LAB — VITAMIN B12: Vitamin B-12: 530 pg/mL (ref 180–914)

## 2022-04-05 LAB — FERRITIN: Ferritin: 91 ng/mL (ref 11–307)

## 2022-04-05 LAB — IRON AND TIBC
Iron: 12 ug/dL — ABNORMAL LOW (ref 28–170)
Saturation Ratios: 4 % — ABNORMAL LOW (ref 10.4–31.8)
TIBC: 300 ug/dL (ref 250–450)
UIBC: 288 ug/dL

## 2022-04-05 LAB — HEMOGLOBIN A1C
Hgb A1c MFr Bld: 5.5 % (ref 4.8–5.6)
Mean Plasma Glucose: 111.15 mg/dL

## 2022-04-05 LAB — FOLATE: Folate: 8.6 ng/mL (ref >5.9)

## 2022-04-05 LAB — MAGNESIUM: Magnesium: 1.7 mg/dL (ref 1.7–2.4)

## 2022-04-05 MED ORDER — HYDROCODONE-ACETAMINOPHEN 5-325 MG PO TABS
1.0000 | ORAL_TABLET | Freq: Four times a day (QID) | ORAL | 0 refills | Status: DC | PRN
Start: 2022-04-05 — End: 2022-05-02

## 2022-04-05 MED ORDER — HYDROCODONE-ACETAMINOPHEN 5-325 MG PO TABS
1.0000 | ORAL_TABLET | Freq: Four times a day (QID) | ORAL | 0 refills | Status: DC | PRN
Start: 2022-04-05 — End: 2022-04-05

## 2022-04-05 MED ORDER — METHOCARBAMOL 500 MG PO TABS
500.0000 mg | ORAL_TABLET | Freq: Four times a day (QID) | ORAL | 0 refills | Status: AC | PRN
Start: 2022-04-05 — End: ?

## 2022-04-05 MED ORDER — METHOCARBAMOL 500 MG PO TABS: 500.0000 mg | ORAL_TABLET | Freq: Four times a day (QID) | ORAL | 0 refills | Status: DC | PRN

## 2022-04-05 MED ORDER — ASPIRIN 325 MG PO TBEC: 325.0000 mg | DELAYED_RELEASE_TABLET | Freq: Every day | ORAL | 0 refills | Status: DC

## 2022-04-05 MED ORDER — ASPIRIN 325 MG PO TBEC: 325.0000 mg | DELAYED_RELEASE_TABLET | Freq: Every day | ORAL | 0 refills | Status: AC

## 2022-04-05 NOTE — [place] FL2 (Signed)
?[place] MEDICAID FL2 LEVEL OF CARE SCREENING TOOL  ?  ? ?IDENTIFICATION  ?Patient Name: ?Meghan Miller Birthdate: 09-Feb-1936 Sex: female Admission Date (Current Location): ?04/01/2022  ?[place] and [place] Number: ? [place] ?  Facility and Address:  ?[facility] [place] [address] [place], [place] [address] ?     Provider Number: ?[id number]  ?Attending Physician Name and Address:  ?[name], MD ? Relative Name and Phone Number:  ?Kaytelyn, Glore Son   [phone] ?   ?Current Level of Care: ?Hospital Recommended Level of Care: ?[place] Prior Approval Number: ?  ? ?Date Approved/Denied: ?  PASRR Number: ?[id number] A ? ?Discharge Plan: ?SNF ?  ? ?Current Diagnoses: ?Patient Active Problem List  ? Diagnosis Date Noted  ? Acute respiratory failure with hypoxia ([place]) 04/04/2022  ? Essential hypertension 04/04/2022  ? Goals of care, counseling/discussion 04/04/2022  ? Bandemia 04/04/2022  ? Elevated d-dimer 04/04/2022  ? Class II obesity 04/04/2022  ? Acute postoperative anemia due to expected blood loss 04/03/2022  ? Primary osteoarthritis of left hip 04/01/2022  ? Unilateral primary osteoarthritis, left hip 11/29/2021  ? ? ?Orientation RESPIRATION BLADDER Height & Weight   ?  ?Self, Time, Situation, Place ? O2 Continent Weight: 229 lb (103.9 kg) ?Height:  '5\' 5"'$  (165.1 cm)  ?BEHAVIORAL SYMPTOMS/MOOD NEUROLOGICAL BOWEL NUTRITION STATUS  ?    Continent Diet (see discharge summary)  ?AMBULATORY STATUS COMMUNICATION OF NEEDS Skin   ?Limited Assist Verbally Surgical wounds ?  ?  ?  ?    ?     ?     ? ? ?Personal Care Assistance Level of Assistance  ?Bathing, Feeding, Dressing Bathing Assistance: Maximum assistance ?Feeding assistance: Limited assistance ?Dressing Assistance: Maximum assistance ?   ? ?Functional Limitations Info  ?Sight, Hearing, Speech Sight Info: Adequate ?Hearing Info: Adequate ?Speech Info: Adequate  ? ? ?SPECIAL CARE FACTORS FREQUENCY  ?PT (By licensed PT),  OT (By licensed OT)   ?  ?PT Frequency: 5x week ?OT Frequency: 5x week ?  ?  ?  ?   ? ? ?Contractures Contractures Info: Not present  ? ? ?Additional Factors Info  ?Code Status, Allergies Code Status Info: DNR ?Allergies Info: Penicillins ?  ?  ?  ?   ? ?Current Medications (04/05/2022):  This is the current hospital active medication list ?Current Facility-Administered Medications  ?Medication Dose Route Frequency Provider Last Rate Last Admin  ? acetaminophen (TYLENOL) tablet 325-650 mg  325-650 mg Oral Q6H PRN Lanae Crumbly, PA-C      ? aspirin EC tablet 325 mg  325 mg Oral Q breakfast Lanae Crumbly, PA-C   325 mg at 04/05/22 7353  ? docusate sodium (COLACE) capsule 100 mg  100 mg Oral BID Lanae Crumbly, PA-C   100 mg at 04/05/22 2992  ? fesoterodine (TOVIAZ) tablet 4 mg  4 mg Oral Daily Lanae Crumbly, PA-C   4 mg at 04/05/22 4268  ? fluticasone (FLONASE) 50 MCG/ACT nasal spray 1 spray  1 spray Each Nare Daily Lanae Crumbly, PA-C   1 spray at 04/05/22 3419  ? hydrALAZINE (APRESOLINE) tablet 25 mg  25 mg Oral Q6H PRN Mercy Riding, MD      ? HYDROcodone-acetaminophen (NORCO/VICODIN) 5-325 MG per tablet 1 tablet  1 tablet Oral Q6H PRN Lanae Crumbly, PA-C      ? menthol-cetylpyridinium (CEPACOL) lozenge 3 mg  1 lozenge Oral PRN Lanae Crumbly, PA-C      ?  Or  ? phenol (CHLORASEPTIC) mouth spray 1 spray  1 spray Mouth/Throat PRN Lanae Crumbly, PA-C   1 spray at 04/01/22 1955  ? methocarbamol (ROBAXIN) tablet 500 mg  500 mg Oral Q6H PRN Lanae Crumbly, PA-C   500 mg at 04/02/22 1224  ? Or  ? methocarbamol (ROBAXIN) 500 mg in dextrose 5 % 50 mL IVPB  500 mg Intravenous Q6H PRN Lanae Crumbly, PA-C      ? metoCLOPramide (REGLAN) tablet 5-10 mg  5-10 mg Oral Q8H PRN Lanae Crumbly, PA-C      ? Or  ? metoCLOPramide (REGLAN) injection 5-10 mg  5-10 mg Intravenous Q8H PRN Lanae Crumbly, PA-C   10 mg at 04/02/22 8250  ? ondansetron (ZOFRAN) tablet 4 mg  4 mg Oral Q6H PRN Lanae Crumbly, PA-C      ? Or  ? ondansetron  (ZOFRAN) injection 4 mg  4 mg Intravenous Q6H PRN Lanae Crumbly, PA-C   4 mg at 04/02/22 0370  ? ? ? ?Discharge Medications: ?Please see discharge summary for a list of discharge medications. ? ?Relevant Imaging Results: ? ?Relevant Lab Results: ? ? ?Additional Information ?SSN: [id number], pt is vaccinated for covid with 3 boosters. ? ?Joanne Chars, LCSW ? ? ? ? ?

## 2022-04-05 NOTE — Progress Notes (Signed)
? ? ? [facility] ?                                                                            ? ? ?Meghan Miller, is a 86 y.o. female, DOB - [birth year], BOF:[id number] ?Admit date - 04/01/2022    ?Outpatient Primary MD for the patient is Neale Burly, MD ? ?LOS - 4  days ? ? ? ?Brief summary  ? ?86 year old F with history hip arthritis, HTN, dyspnea, former smoker and obesity admitted by orthopedic surgery team for chronic hip arthritis.  She underwent left total arthroplasty on 04/01/2022.  She had postoperative blood loss anemia with hemoglobin dropping from 12-9.1.  EBL 800 cc.  She had 1 unit on 4/5 but hemoglobin trended down to 8.3 this morning.  She had another 1 unit.  She was on 2 different IV fluids.  She was also requiring supplemental oxygen.  She was ambulated this afternoon and maintain appropriate saturation but she desaturated to upper 80s after she sat down and was not able to recover above 88%.  Hospitalist service consulted due to acute respiratory failure with hypoxia. ? ?Pt seen and examined today, sitting in the chair and is on 1 lit of [place] oxygen with sats in 99%.  ?She denies any new complaints.  ? ?Assessment & Plan  ? ? ?Assessment and Plan: ?* Primary osteoarthritis of left hip ?S/p left total arthroplasty by Dr. Lorin Mercy.  ?-Management per primary ? ?Acute respiratory failure with hypoxia ([place]) ?Likely due to anemia and atelectasis.  No history of COPD or CHF but she has 25-pack-year history and quit smoking in 1991.  She has elevated D-dimer but low suspicion for VTE. ?Bnp elevated, but CXR does not show any acute pneumonia or fluid.  ?- plan to wean her off oxygen.  ?- pts sats were around 99% earlier this am on 1 lit at rest. Check ambulating oxygen levels and plan to wean her off oxygen in patient or on discharge.  ?-Encourage incentive spirometry/OOB/PT/OT ?-Suggest discontinuing home Coreg if BP controlled with other antihypertensive meds. ?-Trial of DuoNebs as  needed. ?-Treat anemia ? ?Goals of care, counseling/discussion ?Discussed CODE STATUS including pros and cons of CPR and intubation.  Patient stated that she is DNR/DNI. ?-CODE STATUS changed to DNR/DNI ? ?Acute postoperative anemia due to expected blood loss ?Recent Labs  ?  01/10/22 ?1347 03/25/22 ?1350 04/02/22 ?0148 04/03/22 ?0325 04/04/22 ?1412  ?HGB 11.8* 12.3 9.1* 8.3* 9.9*  ?Patient had an EBL of 800 cc documented.  Denies melena or hematochezia.  Transfused 1 unit on 4/4 but Hgb dropped further to 8.3.  Transfused another unit today.  Now Hgb up to 9.9.  There could be some element of hemodilution from IV fluids. ?Anemia panel shows low iron levels, recommend iron supplementation on discharge.  ? ? ? ?Elevated d-dimer ?This is unreliable postop.  Patient is already on full dose aspirin for VTE prophylaxis.  She has no calf tenderness ?-No further work-up ? ?Bandemia ?Likely demargination versus infection. ?-Continue monitoring ? ?Essential hypertension ?Hypotensive yesterday but BP parameters have normalized today.  ?  She is on multiple antihypertensive meds ?- slowly restart meds starting with avapro and hydrochlorothiazide.  ?  Hold amlodipine and coreg on discharge.  ? ?Class II obesity ?Body mass index is 38.11 kg/m?. ?Encourage lifestyle change to lose weight. ? ? ? ? ?  ? ?Estimated body mass index is 38.11 kg/m? as calculated from the following: ?  Height as of this encounter: '5\' 5"'$  (1.651 m). ?  Weight as of this encounter: 103.9 kg. ? ?Code Status: DNR ?DVT Prophylaxis:  SCDs Start: 04/01/22 1639 ?Place TED hose Start: 04/01/22 1639 ? ? ? ?Antimicrobials:  ? ?Anti-infectives (From admission, onward)  ? ? Start     Dose/Rate Route Frequency Ordered Stop  ? 04/01/22 1100  vancomycin (VANCOCIN) IVPB 1000 mg/200 mL premix       ? 1,000 mg ?200 mL/hr over 60 Minutes Intravenous On call to O.R. 04/01/22 1052 04/01/22 1258  ? ?  ? ? ? ?Medications ? ?Scheduled Meds: ? aspirin EC  325 mg Oral Q breakfast  ?  docusate sodium  100 mg Oral BID  ? fesoterodine  4 mg Oral Daily  ? fluticasone  1 spray Each Nare Daily  ? ?Continuous Infusions: ? methocarbamol (ROBAXIN) IV    ? ?PRN Meds:.acetaminophen, hydrALAZINE, HYDROcodone-acetaminophen, menthol-cetylpyridinium **OR** phenol, methocarbamol **OR** methocarbamol (ROBAXIN) IV, metoCLOPramide **OR** metoCLOPramide (REGLAN) injection, ondansetron **OR** ondansetron (ZOFRAN) IV ? ? ? ?Subjective:  ? ?Meghan Miller was seen and examined today.  No new complaints.  ? ?Objective:  ? ?Vitals:  ? 04/04/22 2104 04/05/22 0556 04/05/22 0742 04/05/22 1536  ?BP: (!) 122/55 (!) 154/81 (!) 145/72 129/78  ?Pulse: 87 92 93 80  ?Resp: '20 20 19 18  '$ ?Temp: 98.9 ?F (37.2 ?C) 99.6 ?F (37.6 ?C) 98.3 ?F (36.8 ?C) 97.8 ?F (36.6 ?C)  ?TempSrc: Oral Oral Oral Oral  ?SpO2: 93% 94% 93% 96%  ?Weight:      ?Height:      ? ? ?Intake/Output Summary (Last 24 hours) at 04/05/2022 1556 ?Last data filed at 04/05/2022 0428 ?Gross per 24 hour  ?Intake 413.42 ml  ?Output 700 ml  ?Net -286.58 ml  ? ?Filed Weights  ? 04/01/22 1108  ?Weight: 103.9 kg  ? ? ? ?Exam ?General exam: Appears calm and comfortable  ?Respiratory system: Clear to auscultation. Respiratory effort normal. ?Cardiovascular system: S1 & S2 heard, RRR. No JVD, . No pedal edema. ?Gastrointestinal system: Abdomen is nondistended, soft and nontender. Normal bowel sounds heard. ?Central nervous system: Alert and oriented. No focal neurological deficits. ?Extremities: Symmetric 5 x 5 power. ?Skin: No rashes, lesions or ulcers ?Psychiatry: Mood & affect appropriate.  ? ? ?Data Reviewed:  I have personally reviewed following labs and imaging studies ? ? ?CBC ?Lab Results  ?Component Value Date  ? WBC 12.3 (H) 04/05/2022  ? RBC 3.84 (L) 04/05/2022  ? RBC 3.87 04/05/2022  ? HGB 9.8 (L) 04/05/2022  ? HCT 32.0 (L) 04/05/2022  ? MCV 83.3 04/05/2022  ? MCH 25.5 (L) 04/05/2022  ? PLT 203 04/05/2022  ? MCHC 30.6 04/05/2022  ? RDW 16.5 (H) 04/05/2022  ? LYMPHSABS 1.1  04/04/2022  ? MONOABS 1.0 04/04/2022  ? EOSABS 0.2 04/04/2022  ? BASOSABS 0.0 04/04/2022  ? ? ? ?Last metabolic panel ?Lab Results  ?Component Value Date  ? NA 138 04/05/2022  ? K 3.6 04/05/2022  ? CL 101 04/05/2022  ? CO2 32 04/05/2022  ? BUN 18 04/05/2022  ? CREATININE 0.79 04/05/2022  ? GLUCOSE 129 (H) 04/05/2022  ? GFRNONAA >60 04/05/2022  ? CALCIUM 9.1 04/05/2022  ? PHOS 2.3 (L) 04/05/2022  ? PROT  7.6 03/25/2022  ? ALBUMIN 2.8 (L) 04/05/2022  ? BILITOT 0.9 03/25/2022  ? ALKPHOS 64 03/25/2022  ? AST 24 03/25/2022  ? ALT 13 03/25/2022  ? ANIONGAP 5 04/05/2022  ? ? ?CBG (last 3)  ?No results for input(s): GLUCAP in the last 72 hours.  ? ? ?Coagulation Profile: ?No results for input(s): INR, PROTIME in the last 168 hours. ? ? ?Radiology Studies: ?DG Chest 2 View ? ?Result Date: 04/04/2022 ?CLINICAL DATA:  Hypoxia EXAM: CHEST - 2 VIEW COMPARISON:  04/01/2022 FINDINGS: Transverse diameter of heart is slightly increased. Thoracic aorta is tortuous. There are no signs of pulmonary edema or focal pulmonary consolidation. There is no pleural effusion or pneumothorax. IMPRESSION: No active cardiopulmonary disease. Electronically Signed   By: Elmer Picker M.D.   On: 04/04/2022 15:39  ? ?VAS [place] LOWER EXTREMITY VENOUS (DVT) ? ?Result Date: 04/04/2022 ? Lower Venous DVT Study Patient Name:  Meghan Miller  Date of Exam:   04/04/2022 Medical Rec #: [id number]        Accession #:    [id number] Date of Birth: 1936/07/13       Patient Gender: F Patient Age:   89 years Exam Location:  [facility] Procedure:      VAS [place] LOWER EXTREMITY VENOUS (DVT) Referring Phys: MARK YATES --------------------------------------------------------------------------------  Indications: Pain, Swelling, and Status post total hip, elevated D-Dimer.  Risk Factors: COPD, history of tobacco abuse. Limitations: Hip movement precautions. Comparison Study: No prior study on file Performing Technologist: Sharion Dove RVS  Examination Guidelines: A  complete evaluation includes B-mode imaging, spectral Doppler, color Doppler, and power Doppler as needed of all accessible portions of each vessel. Bilateral testing is considered an integral part of a complete

## 2022-04-05 NOTE — Discharge Summary (Signed)
? ?Patient ID: ?Meghan Miller ?MRN: [id number] ?DOB/AGE: 05/19/1936 86 y.o. ? ?Admit date: 04/01/2022 ?Discharge date: 04/06/2022 ? ?Admission Diagnoses:  ?Principal Problem: ?  Primary osteoarthritis of left hip ?Active Problems: ?  Acute postoperative anemia due to expected blood loss ?  Acute respiratory failure with hypoxia ([place]) ?  Essential hypertension ?  Goals of care, counseling/discussion ?  Bandemia ?  Elevated d-dimer ?  Class II obesity ? ? ?Discharge Diagnoses:  ?Principal Problem: ?  Primary osteoarthritis of left hip ?Active Problems: ?  Acute postoperative anemia due to expected blood loss ?  Acute respiratory failure with hypoxia ([place]) ?  Essential hypertension ?  Goals of care, counseling/discussion ?  Bandemia ?  Elevated d-dimer ?  Class II obesity ? status post Procedure(s): ?LEFT TOTAL HIP ARTHROPLASTY ANTERIOR APPROACH ? ?Past Medical History:  ?Diagnosis Date  ? Anemia   ? "i used to have this all the time"  ? Arthritis   ? Cancer [facility])   ? skin cancer- pt has had 2 spots removed from face/ear area (2020 and 2022)  ? Dyspnea   ? Hypertension   ? Peripheral vascular disease ([place])   ? "blockages in my legs"- "valves not good"- causes leg swelling per patient  ? ? ?Surgeries: Procedure(s): ?LEFT TOTAL HIP ARTHROPLASTY ANTERIOR APPROACH on 04/01/2022 ?  ?Consultants: Treatment Team:  ?Hosie Poisson, MD ? ?Discharged Condition: Improved ? ?Hospital Course: Meghan Miller is an 86 y.o. female who was admitted 04/01/2022 for operative treatment of Arthritis of left hip. Patient failed conservative treatments (please see the history and physical for the specifics) and had severe unremitting pain that affects sleep, daily activities and work/hobbies. After pre-op clearance, the patient was taken to the operating room on 04/01/2022 and underwent  Procedure(s): ?LEFT TOTAL HIP ARTHROPLASTY ANTERIOR APPROACH.   ? ?Patient was given perioperative antibiotics:  ?Anti-infectives (From admission, onward)  ? ?  Start     Dose/Rate Route Frequency Ordered Stop  ? 04/01/22 1100  vancomycin (VANCOCIN) IVPB 1000 mg/200 mL premix       ? 1,000 mg ?200 mL/hr over 60 Minutes Intravenous On call to O.R. 04/01/22 1052 04/01/22 1258  ? ?  ?  ? ?Patient was given sequential compression devices and early ambulation to prevent DVT.  ? ?During the course of patient's hospital stay there were some issues with dropping O2 sats with physical therapy.  Patient was requiring consistent O2 nasal cannula.  Hospitalist service was consulted and assisted in her care.  April 05, 2022 patient doing well and left hip pain controlled.  Son is present during my visit.  Since patient has been progressing slowly with therapy and having respiratory issues as mentioned they now agree to transfer to skilled nursing facility for rehab.  Bed offer made for [facility] rehab and that is available for tomorrow.  Patient will need to be cleared from hospitalist before transfer. ?Recent vital signs: Patient Vitals for the past 24 hrs: ? BP Temp Temp src Pulse Resp SpO2  ?04/05/22 0742 (!) 145/72 98.3 ?F (36.8 ?C) Oral 93 19 93 %  ?04/05/22 0556 (!) 154/81 99.6 ?F (37.6 ?C) Oral 92 20 94 %  ?04/04/22 2104 (!) 122/55 98.9 ?F (37.2 ?C) Oral 87 20 93 %  ?04/04/22 1855 -- -- -- -- -- 93 %  ?04/04/22 1807 -- -- -- -- -- 96 %  ?  ? ?Recent laboratory studies:  ?Recent Labs  ?  04/04/22 ?1412 04/05/22 ?0139  ?WBC 12.3* 12.3*  ?  HGB 9.9* 9.8*  ?HCT 31.7* 32.0*  ?PLT 192 203  ?NA 138 138  ?K 3.6 3.6  ?CL 103 101  ?CO2 31 32  ?BUN 20 18  ?CREATININE 0.89 0.79  ?GLUCOSE 144* 129*  ?CALCIUM 8.7* 9.1  ? ? ? ?Discharge Medications:   ?Allergies as of 04/05/2022   ? ?   Reactions  ? Penicillins Hives, Shortness Of Breath, Rash  ? ?  ? ?  ?Medication List  ?  ? ?STOP taking these medications   ? ?acetaminophen 500 MG tablet ?Commonly known as: TYLENOL ?  ?naproxen sodium 220 MG tablet ?Commonly known as: ALEVE ?  ? ?  ? ?TAKE these medications   ? ?amLODipine 5 MG tablet ?Commonly known  as: NORVASC ?Take 5 mg by mouth daily. ?  ?aspirin 325 MG EC tablet ?Take 1 tablet (325 mg total) by mouth daily with breakfast. MUST TAKE AT LEAST 4 WEEKS POSTOP FOR DVT PROPHYLAXIS ?  ?betamethasone (augmented) 0.05 % lotion ?Commonly known as: DIPROLENE ?Apply 1 application topically daily as needed for itching. ?  ?carvedilol 12.5 MG tablet ?Commonly known as: COREG ?Take 12.5 mg by mouth 2 (two) times daily. ?  ?docusate sodium 100 MG capsule ?Commonly known as: COLACE ?Take 100 mg by mouth daily as needed for moderate constipation. ?  ?fesoterodine 4 MG Tb24 tablet ?Commonly known as: TOVIAZ ?Take 4 mg by mouth daily. ?  ?fluticasone 50 MCG/ACT nasal spray ?Commonly known as: FLONASE ?Place 1 spray into both nostrils daily. ?  ?guaiFENesin 600 MG 12 hr tablet ?Commonly known as: [place] ?Take 600 mg by mouth 2 (two) times daily as needed for cough or to loosen phlegm. ?  ?HYDROcodone-acetaminophen 5-325 MG tablet ?Commonly known as: NORCO/VICODIN ?Take 1 tablet by mouth every 6 (six) hours as needed for moderate pain. ?  ?methocarbamol 500 MG tablet ?Commonly known as: ROBAXIN ?Take 1 tablet (500 mg total) by mouth every 6 (six) hours as needed for muscle spasms. ?  ?olmesartan-hydrochlorothiazide 40-25 MG tablet ?Commonly known as: BENICAR HCT ?Take 1 tablet by mouth daily. ?  ?OVER THE COUNTER MEDICATION ?Take 2 tablets by mouth daily as needed (pain). CBD Gummies ?  ?vitamin B-12 500 MCG tablet ?Commonly known as: CYANOCOBALAMIN ?Take 500 mcg by mouth daily. ?  ?Vitamin D 125 MCG (5000 UT) Caps ?Take 5,000 Units by mouth daily. ?  ? ?  ? ?  ?  ? ? ?  ?Durable Medical Equipment  ?(From admission, onward)  ?  ? ? ?  ? ?  Start     Ordered  ? 04/03/22 1300  For home use only DME Walker rolling  Once       ?Question Answer Comment  ?Walker: With 5 Inch Wheels   ?Patient needs a walker to treat with the following condition Fx   ?  ? 04/03/22 1300  ? 04/03/22 1300  For home use only DME 3 n 1  Once       ?  04/03/22 1300  ? ?  ?  ? ?  ? ? ?Diagnostic Studies: DG Chest 2 View ? ?Result Date: 04/04/2022 ?CLINICAL DATA:  Hypoxia EXAM: CHEST - 2 VIEW COMPARISON:  04/01/2022 FINDINGS: Transverse diameter of heart is slightly increased. Thoracic aorta is tortuous. There are no signs of pulmonary edema or focal pulmonary consolidation. There is no pleural effusion or pneumothorax. IMPRESSION: No active cardiopulmonary disease. Electronically Signed   By: Elmer Picker M.D.   On: 04/04/2022 15:39  ? ?  DG Chest 2 View ? ?Result Date: 04/01/2022 ?CLINICAL DATA:  A female at age 53 presents for evaluation of the chest in preparation for hip replacement. EXAM: CHEST - 2 VIEW COMPARISON:  None FINDINGS: The heart size and mediastinal contours are within normal limits. Both lungs are clear. The visualized skeletal structures are unremarkable. IMPRESSION: No active cardiopulmonary disease. Electronically Signed   By: Zetta Bills M.D.   On: 04/01/2022 11:20  ? ?DG C-Arm 1-60 Min-No Report ? ?Result Date: 04/01/2022 ?Fluoroscopy was utilized by the requesting physician.  No radiographic interpretation.  ? ?DG Hip Port Unilat With Pelvis 1V Left ? ?Result Date: 04/01/2022 ?CLINICAL DATA:  Postop for left hip arthroplasty EXAM: DG HIP (WITH OR WITHOUT PELVIS) 1V PORT LEFT COMPARISON:  Radiograph dated November 12, 2021 FINDINGS: Status post left hip arthroplasty. No acute periprosthetic fracture. Subcutaneous emphysema and surgical clips as expected. IMPRESSION: Status post left hip arthroplasty without acute complications. Electronically Signed   By: Keane Police D.O.   On: 04/01/2022 15:37  ? ?DG HIP UNILAT WITH PELVIS 2-3 VIEWS LEFT ? ?Result Date: 04/01/2022 ?CLINICAL DATA:  Left total hip arthroplasty EXAM: DG HIP (WITH OR WITHOUT PELVIS) 2-3V LEFT COMPARISON:  11/12/2021 FINDINGS: Two C-arm images show total hip arthroplasty on the left. Components appear well positioned. No radiographically detectable complication. IMPRESSION: Good  appearance following total hip arthroplasty on the left. Electronically Signed   By: Nelson Chimes M.D.   On: 04/01/2022 14:55  ? ?VAS [place] LOWER EXTREMITY VENOUS (DVT) ? ?Result Date: 04/04/2022 ? Lower Venous DVT S

## 2022-04-05 NOTE — Progress Notes (Signed)
Per pt son, pt is agreeable to go to rehab facility instead home.  ?Sticky notes left for MD and day shift RN made aware.  ?

## 2022-04-05 NOTE — Progress Notes (Addendum)
Physical Therapy Treatment ?Patient Details ?Name: Meghan Miller ?MRN: [id number] ?DOB: 1936-08-06 ?Today's Date: 04/05/2022 ? ? ?History of Present Illness 86 y.o. F who presents s/p L THA, direct anterior 04/01/2022. Significant PMH: skin CA, PVD, bilateral TKA's. ? ?  ?PT Comments  ? ? Pt has just changed her discharge plan to go to rehab today, and will adjust accordingly on her frequency.  Pt is expecting to continue to work toward home, requiring her to walk independently and to stand alone.  Her increased assistance today is mainly driven by lower O2 sats earlier and sleepiness, which are better with pt being up OOB in chair.  Follow for acute PT goals as are outlined in POC.   ?Recommendations for follow up therapy are one component of a multi-disciplinary discharge planning process, led by the attending physician.  Recommendations may be updated based on patient status, additional functional criteria and insurance authorization. ? ?Follow Up Recommendations ? Follow physician's recommendations for discharge plan and follow up therapies ?  ?  ?Assistance Recommended at Discharge PRN  ?Patient can return home with the following A little help with walking and/or transfers;A little help with bathing/dressing/bathroom;Assistance with cooking/housework;Assist for transportation;Help with stairs or ramp for entrance ?  ?Equipment Recommendations ? Rolling walker (2 wheels);BSC/3in1  ?  ?Recommendations for Other Services OT consult ? ? ?  ?Precautions / Restrictions Precautions ?Precautions: Fall;Other (comment) ?Precaution Comments: watch BP, O2 ?Restrictions ?Weight Bearing Restrictions: Yes ?LLE Weight Bearing: Weight bearing as tolerated  ?  ? ?Mobility ? Bed Mobility ?Overal bed mobility: Needs Assistance ?Bed Mobility: Supine to Sit ?  ?  ?Supine to sit: Min assist, Mod assist ?  ?  ?General bed mobility comments: min assist to pivot to EOB with brief time mod assist ?  ? ?Transfers ?Overall transfer level:  Needs assistance ?Equipment used: 1 person hand held assist ?Transfers: Sit to/from Stand, Bed to chair/wheelchair/BSC ?Sit to Stand: Min assist ?  ?Step pivot transfers: Min guard ?  ?  ?  ?  ?  ? ?Ambulation/Gait ?Ambulation/Gait assistance: Min assist, Min guard ?Gait Distance (Feet): 35 Feet ?Assistive device: Rolling walker (2 wheels) ?Gait Pattern/deviations: Step-to pattern, Step-through pattern, Decreased stride length ?Gait velocity: decreased ?  ?Pre-gait activities: standing balance ck ?General Gait Details: pt was assisted to BR and mainly needs help to get on her feet from sitting esp low chair ? ? ?Stairs ?  ?  ?  ?  ?  ? ? ?Wheelchair Mobility ?  ? ?Modified Rankin (Stroke Patients Only) ?  ? ? ?  ?Balance   ?  ?Sitting balance-Leahy Scale: Fair ?  ?  ?  ?Standing balance-Leahy Scale: Poor ?Standing balance comment: requires support of walker and assisting her to stand fully upright ?  ?  ?  ?  ?  ?  ?  ?  ?  ?  ?  ?  ? ?  ?Cognition Arousal/Alertness: Lethargic, Awake/alert ?Behavior During Therapy: [facility] for tasks assessed/performed ?Overall Cognitive Status: Within Functional Limits for tasks assessed ?  ?  ?  ?  ?  ?  ?  ?  ?  ?  ?  ?  ?  ?  ?  ?  ?  ?  ?  ? ?  ?Exercises Total Joint Exercises ?Ankle Circles/Pumps: AAROM, 5 reps ?Quad Sets: AROM, 10 reps ?Gluteal Sets: AROM, 10 reps ?Heel Slides: Strengthening, 10 reps ?Hip ABduction/ADduction: AAROM, 10 reps ?Long Arc Quad: Strengthening, 10 reps ? ?  ?  General Comments General comments (skin integrity, edema, etc.): pt has good tolerance to move but is sleepy and slow with gait, requiring a bit of extra help.  Pt forgot to replace her cannula on her nose with minor use of tissue and desatted to 82%, practiced pursed lip breathing and recovered to 97% ?  ?  ? ?Pertinent Vitals/Pain Pain Assessment ?Pain Assessment: Faces ?Faces Pain Scale: Hurts a little bit ?Breathing: normal ?Negative Vocalization: none ?Facial Expression: smiling or  inexpressive ?Body Language: relaxed ?Consolability: no need to console ?PAINAD Score: 0 ?Pain Location: L Hip with mobility ?Pain Descriptors / Indicators: Guarding  ? ? ?Home Living   ?  ?  ?  ?  ?  ?  ?  ?  ?  ?   ?  ?Prior Function    ?  ?  ?   ? ?PT Goals (current goals can now be found in the care plan section) Acute Rehab PT Goals ?Patient Stated Goal: to get home ? ?  ?Frequency ? ? ? Min 5X/week ? ? ? ?  ?PT Plan Current plan remains appropriate;Frequency needs to be updated  ? ? ?Co-evaluation   ?  ?  ?  ?  ? ?  ?AM-PAC PT "6 Clicks" Mobility   ?Outcome Measure ? Help needed turning from your back to your side while in a flat bed without using bedrails?: A Little ?Help needed moving from lying on your back to sitting on the side of a flat bed without using bedrails?: A Little ?Help needed moving to and from a bed to a chair (including a wheelchair)?: A Little ?Help needed standing up from a chair using your arms (e.g., wheelchair or bedside chair)?: A Little ?Help needed to walk in hospital room?: A Little ?Help needed climbing 3-5 steps with a railing? : A Lot ?6 Click Score: 17 ? ?  ?End of Session Equipment Utilized During Treatment: Gait belt;Oxygen ?Activity Tolerance: Treatment limited secondary to medical complications (Comment) ?Patient left: in chair;with call bell/phone within reach;with chair alarm set ?Nurse Communication: Mobility status ?PT Visit Diagnosis: Unsteadiness on feet (R26.81);Other abnormalities of gait and mobility (R26.89);Difficulty in walking, not elsewhere classified (R26.2);Pain ?Pain - Right/Left: Left ?Pain - part of body: Hip ?  ? ? ?Time: 1007-1219 ?PT Time Calculation (min) (ACUTE ONLY): 34 min ? ?Charges:  $Gait Training: 8-22 mins ?$Therapeutic Exercise: 8-22 mins    ?Ramond Dial ?04/05/2022, 10:40 AM ? ?Mee Hives, PT PhD ?Acute Rehab Dept. Number: [facility] [phone] and [place] [phone] ? ? ?

## 2022-04-05 NOTE — Progress Notes (Signed)
Subjective: ?Patient doing well. Pain controlled.  Patient and son now agree to transfer to [facility] tomorrow due to slow progression with therapy and pulmonary issues.  Patient will need to be cleared by hospitalist team before transfer.  ? ?Objective: ?Vital signs in last 24 hours: ?Temp:  [98.3 ?F (36.8 ?C)-99.6 ?F (37.6 ?C)] 98.3 ?F (36.8 ?C) (04/07 8144) ?Pulse Rate:  [87-93] 93 (04/07 0742) ?Resp:  [19-20] 19 (04/07 0742) ?BP: (122-154)/(55-81) 145/72 (04/07 0742) ?SpO2:  [93 %-96 %] 93 % (04/07 0742) ?FiO2 (%):  [32 %] 32 % (04/06 1807) ? ?Intake/Output from previous day: ?04/06 0701 - 04/07 0700 ?In: 413.4 [P.O.:240; I.V.:173.4] ?Out: 1000 [Urine:1000] ?Intake/Output this shift: ?No intake/output data recorded. ? ?Recent Labs  ?  04/03/22 ?0325 04/04/22 ?1412 04/05/22 ?0139  ?HGB 8.3* 9.9* 9.8*  ? ?Recent Labs  ?  04/04/22 ?1412 04/05/22 ?0139  ?WBC 12.3* 12.3*  ?RBC 3.86* 3.84*  3.87  ?HCT 31.7* 32.0*  ?PLT 192 203  ? ?Recent Labs  ?  04/04/22 ?1412 04/05/22 ?0139  ?NA 138 138  ?K 3.6 3.6  ?CL 103 101  ?CO2 31 32  ?BUN 20 18  ?CREATININE 0.89 0.79  ?GLUCOSE 144* 129*  ?CALCIUM 8.7* 9.1  ? ?No results for input(s): LABPT, INR in the last 72 hours. ? ?Exam ?Very pleasant female alert and oriented in no acute distress.  Son is present during my visit.  Left hip wound looks good.  Staples intact.  There is some trace serous drainage on dressing.  Nothing active from the wound during my inspection.  No signs of infection. ? ? ? ?Assessment/Plan: ?Continue present care.  If patient is stable from a medical standpoint tomorrow should be okay to transfer to [facility] rehab. ?Prescription on chart for Norco 5/325, Robaxin and aspirin. ? ? ? ? ?Benjiman Core ?04/05/2022, 3:00 PM  ? ? ? ? ?

## 2022-04-05 NOTE — Progress Notes (Addendum)
SpO2 in low 80s on 2L nasal cannula when pt gets out of bed to use BSC. It takes about 5-10 min for pt to recover and get her SpO2 in 90s.  ?Lung sound clear and diminished, minimal use of accessory muscles, pt reports minimal SOB, able to speak in full sentences, and does not seem to be in distress.  ?

## 2022-04-05 NOTE — TOC Progression Note (Addendum)
Transition of Care (TOC) - Progression Note  ? ? ?Patient Details  ?Name: Meghan Miller ?MRN: [id number] ?Date of Birth: 1936-05-27 ? ?Transition of Care (TOC) CM/SW Contact  ?Joanne Chars, LCSW ?Phone Number: ?04/05/2022, 12:48 PM ? ?Clinical Narrative:   CSW informed that pt and son now requesting SNF placement.  Pt 6 click score 17.  CSW spoke with pt who confirms this, choice document given, permission given to send out referral in hub.  Pt from [place], would like SNF close to that area.  Permission given to speak with son Elta Gu[place].  CSW spoke to [place] by phone, he confirms choice for SNF as well.  Discussed that it is possible SNF will not be approved, if that is the case, son willing to take pt home. ? ?Referral sent out in hub for SNF.   ? ?1420: bed offer from [facility], son Elta Gu[place] and CSW spoke with pt who accepts offer.  CSW confirmed with [facility] at [facility] rehab that she can accept tomorrow.  No auth needed.  ? ?Expected Discharge Plan: Home/Self Care ?Barriers to Discharge: Continued Medical Work up ? ?Expected Discharge Plan and Services ?Expected Discharge Plan: Home/Self Care ?  ?Discharge Planning Services: CM Consult ?  ?  ?                ?DME Arranged: 3-N-1, Walker rolling ?DME Agency: Other - Comment ([place]) ?Date DME Agency Contacted: 04/03/22 ?Time DME Agency Contacted: [phone] ?Representative spoke with at DME Agency: Brenton Grills ?  ?  ?  ?  ?  ? ? ?Social Determinants of Health (SDOH) Interventions ?  ? ?Readmission Risk Interventions ?   ? View : No data to display.  ?  ?  ?  ? ? ?

## 2022-04-06 DIAGNOSIS — J9601 Acute respiratory failure with hypoxia: Secondary | ICD-10-CM | POA: Diagnosis not present

## 2022-04-06 DIAGNOSIS — M1612 Unilateral primary osteoarthritis, left hip: Secondary | ICD-10-CM | POA: Diagnosis not present

## 2022-04-06 DIAGNOSIS — E669 Obesity, unspecified: Secondary | ICD-10-CM | POA: Diagnosis not present

## 2022-04-06 MED ORDER — FUROSEMIDE 20 MG PO TABS
20.0000 mg | ORAL_TABLET | Freq: Every day | ORAL | Status: DC
  Administered 2022-04-06 – 2022-04-08 (×3): 20 mg via ORAL
  Filled 2022-04-06 (×3): qty 1

## 2022-04-06 NOTE — Progress Notes (Signed)
?  Subjective: ?Patient stable.  Pain controlled.  Ambulating in the room with decreasing pain  ? ?Objective: ?Vital signs in last 24 hours: ?Temp:  [97.8 ?F (36.6 ?C)-98.6 ?F (37 ?C)] 98.6 ?F (37 ?C) (04/08 3474) ?Pulse Rate:  [80-87] 87 (04/08 0707) ?Resp:  [18-20] 20 (04/08 0707) ?BP: (103-129)/(54-78) 103/54 (04/08 0707) ?SpO2:  [96 %] 96 % (04/08 0707) ? ?Intake/Output from previous day: ?04/07 0701 - 04/08 0700 ?In: -  ?Out: 500 [Urine:500] ?Intake/Output this shift: ?No intake/output data recorded. ? ?Exam: ? ?Intact pulses distally ?Dorsiflexion/Plantar flexion intact ? ?Labs: ?Recent Labs  ?  04/04/22 ?1412 04/05/22 ?0139  ?HGB 9.9* 9.8*  ? ?Recent Labs  ?  04/04/22 ?1412 04/05/22 ?0139  ?WBC 12.3* 12.3*  ?RBC 3.86* 3.84*  3.87  ?HCT 31.7* 32.0*  ?PLT 192 203  ? ?Recent Labs  ?  04/04/22 ?1412 04/05/22 ?0139  ?NA 138 138  ?K 3.6 3.6  ?CL 103 101  ?CO2 31 32  ?BUN 20 18  ?CREATININE 0.89 0.79  ?GLUCOSE 144* 129*  ?CALCIUM 8.7* 9.1  ? ?No results for input(s): LABPT, INR in the last 72 hours. ? ?Assessment/Plan: ?Plan at this time is discharged to skilled nursing likely on Monday.  Patient overall is making good progress and improvement compared to her immediate postoperative status ? ? ?Anderson Malta ?04/06/2022, 8:36 AM  ? ? ?  ?

## 2022-04-06 NOTE — TOC Progression Note (Incomplete)
Transition of Care (TOC) - Progression Note  ? ? ?Patient Details  ?Name: Kachina Niederer ?MRN: [id number] ?Date of Birth: 1936/04/19 ? ?Transition of Care (TOC) CM/SW Contact  ?[place], LCSWA ?Phone Number: ?04/06/2022, 12:07 PM ? ?Clinical Narrative:    ? ? ? ?Expected Discharge Plan: Home/Self Care ?Barriers to Discharge: Continued Medical Work up ? ?Expected Discharge Plan and Services ?Expected Discharge Plan: Home/Self Care ?  ?Discharge Planning Services: CM Consult ?  ?  ?                ?DME Arranged: 3-N-1, Walker rolling ?DME Agency: Other - Comment ([place]) ?Date DME Agency Contacted: 04/03/22 ?Time DME Agency Contacted: [phone] ?Representative spoke with at DME Agency: Brenton Grills ?  ?  ?  ?  ?  ? ? ?Social Determinants of Health (SDOH) Interventions ?  ? ?Readmission Risk Interventions ?   ? View : No data to display.  ?  ?  ?  ? ? ?

## 2022-04-06 NOTE — Progress Notes (Signed)
? ? ? [facility] ?                                                                            ? ? ?Meghan Miller, is a 86 y.o. female, DOB - 1936-06-05, TIW:[id number] ?Admit date - 04/01/2022    ?Outpatient Primary MD for the patient is Neale Burly, MD ? ?LOS - 5  days ? ? ? ?Brief summary  ? ?86 year old F with history hip arthritis, HTN, dyspnea, former smoker and obesity admitted by orthopedic surgery team for chronic hip arthritis.  She underwent left total arthroplasty on 04/01/2022.  She had postoperative blood loss anemia with hemoglobin dropping from 12-9.1.  EBL 800 cc.  She had 1 unit on 4/5 but hemoglobin trended down to 8.3 this morning.  She had another 1 unit.  She was on 2 different IV fluids.  She was also requiring supplemental oxygen.  She was ambulated this afternoon and maintain appropriate saturation but she desaturated to upper 80s after she sat down and was not able to recover above 88%.  Hospitalist service consulted due to acute respiratory failure with hypoxia. ? ?Pt seen and examined today, sitting in the chair and is on 1 lit of [place] oxygen with sats in 99%.  ?She denies any new complaints.  ? ?Assessment & Plan  ? ? ?Assessment and Plan: ?* Primary osteoarthritis of left hip ?S/p left total arthroplasty by Dr. Lorin Mercy.  ?-Management per primary ? ?Acute respiratory failure with hypoxia ([place]) ?Likely due to anemia and atelectasis.   Seh appears slightly fluid overloaded. No history of COPD or CHF but she has 25-pack-year history and quit smoking in 1991.  She has elevated D-dimer but low suspicion for VTE. ?Bnp elevated, but CXR does not show any acute pneumonia or fluid. We will start her on low dose lasix and monitor urine output.  ?- currently on 2lit of [place] Oxygen, weaned down to 1.5 lit.  ?Check ambulating oxygen levels and plan to wean her off oxygen in patient or on discharge.  ?-Encourage incentive spirometry/OOB/PT/OT ?-Suggest discontinuing home Coreg if BP controlled  with other antihypertensive meds. ?-Trial of DuoNebs as needed. ?-Treat anemia ? ?Goals of care, counseling/discussion ?Discussed CODE STATUS including pros and cons of CPR and intubation.  Patient stated that she is DNR/DNI. ?-CODE STATUS changed to DNR/DNI ? ?Acute postoperative anemia due to expected blood loss ?Recent Labs  ?  01/10/22 ?1347 03/25/22 ?1350 04/02/22 ?0148 04/03/22 ?0325 04/04/22 ?1412  ?HGB 11.8* 12.3 9.1* 8.3* 9.9*  ?Patient had an EBL of 800 cc documented.  Denies melena or hematochezia.   ?Transfused 2 units of prbc transfusion. Hemoglobin remains stable. Check cbc in am.  ?Anemia panel shows low iron levels, recommend iron supplementation on discharge.  ? ? ? ?Elevated d-dimer ?This is unreliable postop.  Patient is already on full dose aspirin for VTE prophylaxis.  She has no calf tenderness ?-No further work-up ? ? ? ?Essential hypertension ?BP parameters are optimal.  ?Will hold off all home BP meds for now.  ? ?Class II obesity ?Body mass index is 38.11 kg/m?. ?Encourage lifestyle change to lose weight. ? ? ? ? ?  ? ?Estimated body mass  index is 38.11 kg/m? as calculated from the following: ?  Height as of this encounter: '5\' 5"'$  (1.651 m). ?  Weight as of this encounter: 103.9 kg. ? ?Code Status: DNR ?DVT Prophylaxis:  SCDs Start: 04/01/22 1639 ?Place TED hose Start: 04/01/22 1639 ? ? ? ?Antimicrobials:  ? ?Anti-infectives (From admission, onward)  ? ? Start     Dose/Rate Route Frequency Ordered Stop  ? 04/01/22 1100  vancomycin (VANCOCIN) IVPB 1000 mg/200 mL premix       ? 1,000 mg ?200 mL/hr over 60 Minutes Intravenous On call to O.R. 04/01/22 1052 04/01/22 1258  ? ?  ? ? ? ?Medications ? ?Scheduled Meds: ? aspirin EC  325 mg Oral Q breakfast  ? docusate sodium  100 mg Oral BID  ? fesoterodine  4 mg Oral Daily  ? fluticasone  1 spray Each Nare Daily  ? furosemide  20 mg Oral Daily  ? ?Continuous Infusions: ? methocarbamol (ROBAXIN) IV    ? ?PRN Meds:.acetaminophen, hydrALAZINE,  HYDROcodone-acetaminophen, menthol-cetylpyridinium **OR** phenol, methocarbamol **OR** methocarbamol (ROBAXIN) IV, metoCLOPramide **OR** metoCLOPramide (REGLAN) injection, ondansetron **OR** ondansetron (ZOFRAN) IV ? ? ? ?Subjective:  ? ?Meghan Miller was seen and examined today.  Appears comfortable.  ? ?Objective:  ? ?Vitals:  ? 04/05/22 1536 04/06/22 0707 04/06/22 1607 04/06/22 1709  ?BP: 129/78 (!) 103/54  131/67  ?Pulse: 80 87 89 87  ?Resp: '18 20  17  '$ ?Temp: 97.8 ?F (36.6 ?C) 98.6 ?F (37 ?C)  98 ?F (36.7 ?C)  ?TempSrc: Oral Oral  Oral  ?SpO2: 96% 96% 94% 98%  ?Weight:      ?Height:      ? ? ?Intake/Output Summary (Last 24 hours) at 04/06/2022 1723 ?Last data filed at 04/06/2022 0200 ?Gross per 24 hour  ?Intake --  ?Output 500 ml  ?Net -500 ml  ? ? ?Filed Weights  ? 04/01/22 1108  ?Weight: 103.9 kg  ? ? ? ?Exam ?General exam: Appears calm and comfortable  ?Respiratory system: Clear to auscultation. Respiratory effort normal. On 2 lit of [place] oxygen.  ?Cardiovascular system: S1 & S2 heard, RRR. No JVD,  ?Gastrointestinal system: Abdomen is nondistended, soft and nontender.  Normal bowel sounds heard. ?Central nervous system: Alert and oriented. No focal neurological deficits. ?Extremities: some leg edema.  ?Skin: No rashes, lesions or ulcers ?Psychiatry:  Mood & affect appropriate.  ? ? ? ?Data Reviewed:  I have personally reviewed following labs and imaging studies ? ? ?CBC ?Lab Results  ?Component Value Date  ? WBC 12.3 (H) 04/05/2022  ? RBC 3.84 (L) 04/05/2022  ? RBC 3.87 04/05/2022  ? HGB 9.8 (L) 04/05/2022  ? HCT 32.0 (L) 04/05/2022  ? MCV 83.3 04/05/2022  ? MCH 25.5 (L) 04/05/2022  ? PLT 203 04/05/2022  ? MCHC 30.6 04/05/2022  ? RDW 16.5 (H) 04/05/2022  ? LYMPHSABS 1.1 04/04/2022  ? MONOABS 1.0 04/04/2022  ? EOSABS 0.2 04/04/2022  ? BASOSABS 0.0 04/04/2022  ? ? ? ?Last metabolic panel ?Lab Results  ?Component Value Date  ? NA 138 04/05/2022  ? K 3.6 04/05/2022  ? CL 101 04/05/2022  ? CO2 32 04/05/2022  ? BUN 18  04/05/2022  ? CREATININE 0.79 04/05/2022  ? GLUCOSE 129 (H) 04/05/2022  ? GFRNONAA >60 04/05/2022  ? CALCIUM 9.1 04/05/2022  ? PHOS 2.3 (L) 04/05/2022  ? PROT 7.6 03/25/2022  ? ALBUMIN 2.8 (L) 04/05/2022  ? BILITOT 0.9 03/25/2022  ? ALKPHOS 64 03/25/2022  ? AST 24 03/25/2022  ?  ALT 13 03/25/2022  ? ANIONGAP 5 04/05/2022  ? ? ?CBG (last 3)  ?No results for input(s): GLUCAP in the last 72 hours.  ? ? ?Coagulation Profile: ?No results for input(s): INR, PROTIME in the last 168 hours. ? ? ?Radiology Studies: ?No results found. ? ? ? ? ?Hosie Poisson M.D. ?[facility] ?04/06/2022, 5:23 PM ? ?Available via Epic secure chat 7am-7pm ?After 7 pm, please refer to night coverage provider listed on amion. ? ? ? ?

## 2022-04-07 DIAGNOSIS — I10 Essential (primary) hypertension: Secondary | ICD-10-CM | POA: Diagnosis not present

## 2022-04-07 DIAGNOSIS — J9601 Acute respiratory failure with hypoxia: Secondary | ICD-10-CM | POA: Diagnosis not present

## 2022-04-07 NOTE — Progress Notes (Signed)
?  Subjective: ?Patient stable.  Pain controlled.  Spent most of the day yesterday sitting up in the chair.  Importance of aspirin for DVT prophylaxis reinforced with the patient  ? ?Objective: ?Vital signs in last 24 hours: ?Temp:  [97.6 ?F (36.4 ?C)-98 ?F (36.7 ?C)] 98 ?F (36.7 ?C) (04/09 0830) ?Pulse Rate:  [87-89] 87 (04/08 2139) ?Resp:  [17-20] 20 (04/09 0830) ?BP: (131-147)/(62-82) 143/82 (04/09 0830) ?SpO2:  [94 %-100 %] 100 % (04/09 0830) ? ?Intake/Output from previous day: ?04/08 0701 - 04/09 0700 ?In: -  ?Out: 350 [Urine:350] ?Intake/Output this shift: ?No intake/output data recorded. ? ?Exam: ? ?Sensation intact distally ?Dorsiflexion/Plantar flexion intact ? ?Labs: ?Recent Labs  ?  04/04/22 ?1412 04/05/22 ?0139  ?HGB 9.9* 9.8*  ? ?Recent Labs  ?  04/04/22 ?1412 04/05/22 ?0139  ?WBC 12.3* 12.3*  ?RBC 3.86* 3.84*  3.87  ?HCT 31.7* 32.0*  ?PLT 192 203  ? ?Recent Labs  ?  04/04/22 ?1412 04/05/22 ?0139  ?NA 138 138  ?K 3.6 3.6  ?CL 103 101  ?CO2 31 32  ?BUN 20 18  ?CREATININE 0.89 0.79  ?GLUCOSE 144* 129*  ?CALCIUM 8.7* 9.1  ? ?No results for input(s): LABPT, INR in the last 72 hours. ? ?Assessment/Plan: ?Plan at this time is therapy today.  Anticipate discharge tomorrow.  Respiratory status looks reasonable today. ? ? ?Anderson Malta ?04/07/2022, 8:53 AM  ? ? ?  ?

## 2022-04-07 NOTE — Plan of Care (Signed)
  Problem: Activity: Goal: Risk for activity intolerance will decrease Outcome: Progressing   Problem: Nutrition: Goal: Adequate nutrition will be maintained Outcome: Progressing   Problem: Coping: Goal: Level of anxiety will decrease Outcome: Progressing   Problem: Pain Managment: Goal: General experience of comfort will improve Outcome: Progressing   Problem: Safety: Goal: Ability to remain free from injury will improve Outcome: Progressing   

## 2022-04-07 NOTE — Progress Notes (Signed)
? ? ? [facility] ?                                                                            ? ? ?Meghan Miller, is a 86 y.o. female, DOB - 02-28-1936, TGG:[id number] ?Admit date - 04/01/2022    ?Outpatient Primary MD for the patient is Neale Burly, MD ? ?LOS - 6  days ? ? ? ?Brief summary  ? ?86 year old F with history hip arthritis, HTN, dyspnea, former smoker and obesity admitted by orthopedic surgery team for chronic hip arthritis.  She underwent left total arthroplasty on 04/01/2022.  She had postoperative blood loss anemia with hemoglobin dropping from 12-9.1.  EBL 800 cc.  She had 1 unit on 4/5 but hemoglobin trended down to 8.3 this morning.  She had another 1 unit.  She was on 2 different IV fluids.  She was also requiring supplemental oxygen.  She was ambulated this afternoon and maintain appropriate saturation but she desaturated to upper 80s after she sat down and was not able to recover above 88%.  Hospitalist service consulted due to acute respiratory failure with hypoxia. ?Her respiratory status improving. Check ambulating oxygen levels.  ? ?Assessment & Plan  ? ? ?Assessment and Plan: ?* Primary osteoarthritis of left hip ?S/p left total arthroplasty by Dr. Lorin Mercy.  ?-Management per primary. Possible d/c in am to SNF.  ? ?Acute respiratory failure with hypoxia ([place]) ?Likely due to anemia and atelectasis.   She appears slightly fluid overloaded.  ?No history of COPD or CHF but she has 25-pack-year history and quit smoking in 1991.  She has elevated D-dimer but low suspicion for VTE. ?Bnp elevated, but CXR does not show any acute pneumonia or fluid. We will start her on low dose lasix and monitor urine output.  ?Check ambulating oxygen levels and plan to wean her off oxygen on discharge.  ?-Encourage incentive spirometry/OOB/PT/OT ?-Trial of DuoNebs as needed. ?-Treat anemia ? ?Goals of care, counseling/discussion ?Discussed CODE STATUS including pros and cons of CPR and intubation.   Patient stated that she is DNR/DNI. ?-CODE STATUS changed to DNR/DNI ? ?Acute postoperative anemia due to expected blood loss ?Recent Labs  ?  01/10/22 ?1347 03/25/22 ?1350 04/02/22 ?0148 04/03/22 ?0325 04/04/22 ?1412  ?HGB 11.8* 12.3 9.1* 8.3* 9.9*  ?Patient had an EBL of 800 cc documented.  Denies melena or hematochezia.   ?Transfused 2 units of prbc transfusion. Hemoglobin remains stable. Check cbc in am.  ?Anemia panel shows low iron levels, recommend iron supplementation on discharge.  ? ? ? ?Elevated d-dimer ?This is unreliable postop.  Patient is already on full dose aspirin for VTE prophylaxis.  She has no calf tenderness ?-No further work-up ? ? ? ?Essential hypertension ?BP parameters are well controlled.  ?On lasix 20 mg daily.  ? ?Class II obesity ?Body mass index is 38.11 kg/m?. ?Encourage lifestyle change to lose weight. ? ? ?Leukocytosis: ?- check cbc in am.  ? ?  ? ?Estimated body mass index is 38.11 kg/m? as calculated from the following: ?  Height as of this encounter: '5\' 5"'$  (1.651 m). ?  Weight as of this encounter: 103.9 kg. ? ?Code Status: DNR ?DVT Prophylaxis:  SCDs Start: 04/01/22 1639 ?Place TED hose Start: 04/01/22 1639 ? ? ? ?Antimicrobials:  ? ?Anti-infectives (From admission, onward)  ? ? Start     Dose/Rate Route Frequency Ordered Stop  ? 04/01/22 1100  vancomycin (VANCOCIN) IVPB 1000 mg/200 mL premix       ? 1,000 mg ?200 mL/hr over 60 Minutes Intravenous On call to O.R. 04/01/22 1052 04/01/22 1258  ? ?  ? ? ? ?Medications ? ?Scheduled Meds: ? aspirin EC  325 mg Oral Q breakfast  ? docusate sodium  100 mg Oral BID  ? fesoterodine  4 mg Oral Daily  ? fluticasone  1 spray Each Nare Daily  ? furosemide  20 mg Oral Daily  ? ?Continuous Infusions: ? methocarbamol (ROBAXIN) IV    ? ?PRN Meds:.acetaminophen, hydrALAZINE, HYDROcodone-acetaminophen, menthol-cetylpyridinium **OR** phenol, methocarbamol **OR** methocarbamol (ROBAXIN) IV, metoCLOPramide **OR** metoCLOPramide (REGLAN) injection,  ondansetron **OR** ondansetron (ZOFRAN) IV ? ? ? ?Subjective:  ? ?Meghan Miller was seen and examined today.  No chest pain or sob. No nausea or vomiting.  ? ?Objective:  ? ?Vitals:  ? 04/06/22 1709 04/06/22 2139 04/07/22 0303 04/07/22 0830  ?BP: 131/67 (!) 147/62 132/67 (!) 143/82  ?Pulse: 87 87    ?Resp: '17 20 20 20  '$ ?Temp: 98 ?F (36.7 ?C) 98 ?F (36.7 ?C) 97.6 ?F (36.4 ?C) 98 ?F (36.7 ?C)  ?TempSrc: Oral Oral Oral Oral  ?SpO2: 98% 97%  100%  ?Weight:      ?Height:      ? ? ?Intake/Output Summary (Last 24 hours) at 04/07/2022 1246 ?Last data filed at 04/07/2022 0900 ?Gross per 24 hour  ?Intake 240 ml  ?Output 350 ml  ?Net -110 ml  ? ? ?Filed Weights  ? 04/01/22 1108  ?Weight: 103.9 kg  ? ? ? ?Exam ?General exam: Appears calm and comfortable  ?Respiratory system: diminished air entry at bases, on 1.5 lit of [place] oxygen.  ?Cardiovascular system: S1 & S2 heard, RRR.  No pedal edema. ?Gastrointestinal system: Abdomen is nondistended, soft and nontender. Normal bowel sounds heard. ?Central nervous system: Alert and oriented. No focal neurological deficits. ?Extremities: Symmetric 5 x 5 power. ?Skin: No rashes, lesions or ulcers ?Psychiatry: Mood & affect appropriate.  ? ? ? ? ?Data Reviewed:  I have personally reviewed following labs and imaging studies ? ? ?CBC ?Lab Results  ?Component Value Date  ? WBC 12.3 (H) 04/05/2022  ? RBC 3.84 (L) 04/05/2022  ? RBC 3.87 04/05/2022  ? HGB 9.8 (L) 04/05/2022  ? HCT 32.0 (L) 04/05/2022  ? MCV 83.3 04/05/2022  ? MCH 25.5 (L) 04/05/2022  ? PLT 203 04/05/2022  ? MCHC 30.6 04/05/2022  ? RDW 16.5 (H) 04/05/2022  ? LYMPHSABS 1.1 04/04/2022  ? MONOABS 1.0 04/04/2022  ? EOSABS 0.2 04/04/2022  ? BASOSABS 0.0 04/04/2022  ? ? ? ?Last metabolic panel ?Lab Results  ?Component Value Date  ? NA 138 04/05/2022  ? K 3.6 04/05/2022  ? CL 101 04/05/2022  ? CO2 32 04/05/2022  ? BUN 18 04/05/2022  ? CREATININE 0.79 04/05/2022  ? GLUCOSE 129 (H) 04/05/2022  ? GFRNONAA >60 04/05/2022  ? CALCIUM 9.1  04/05/2022  ? PHOS 2.3 (L) 04/05/2022  ? PROT 7.6 03/25/2022  ? ALBUMIN 2.8 (L) 04/05/2022  ? BILITOT 0.9 03/25/2022  ? ALKPHOS 64 03/25/2022  ? AST 24 03/25/2022  ? ALT 13 03/25/2022  ? ANIONGAP 5 04/05/2022  ? ? ?CBG (last 3)  ?No results for input(s): GLUCAP in the last 72  hours.  ? ? ?Coagulation Profile: ?No results for input(s): INR, PROTIME in the last 168 hours. ? ? ?Radiology Studies: ?No results found. ? ? ? ? ?Hosie Poisson M.D. ?[facility] ?04/07/2022, 12:46 PM ? ?Available via Epic secure chat 7am-7pm ?After 7 pm, please refer to night coverage provider listed on amion. ? ? ? ?

## 2022-04-07 NOTE — Progress Notes (Signed)
SATURATION QUALIFICATIONS: (This note is used to comply with regulatory documentation for home oxygen) ? ?Patient Saturations on Room Air at Rest = 92% ? ?Patient Saturations on Room Air while Ambulating = 82% ? ?Patient Saturations on 1 Liters of oxygen while Ambulating = 92% ? ?Please briefly explain why patient needs home oxygen: Patient stated she felt a little short of breath ?

## 2022-04-07 NOTE — Progress Notes (Addendum)
Occupational Therapy Treatment ?Patient Details ?Name: Meghan Miller ?MRN: [id number] ?DOB: April 23, 1936 ?Today's Date: 04/07/2022 ? ? ?History of present illness 86 y.o. F who presents s/p L THA, direct anterior 04/01/2022. Significant PMH: skin CA, PVD, bilateral TKA's. ?  ?OT comments ? Pt making progress towards goals this session, min guard -supervision for ADLs, max A for LB ADL. Pt min-mod A for sit to stand transfer, benefits from elevated surfaces. Pt able to ambulate in room and hall, SpO2 reading in 90's on RA during hallway ambulation, however dropping to low 80's once returned to chair after ~5 mins. RN notified, advised may want to complete another O2 walk test later to ensure O2 sensor has good read. Pt returned to 4L O2 at end of session, SpO2 in mid-90's. Pt presenting with impairments listed below, will follow acutely. Continue to recommend HHOT at d/c.  ? ?Recommendations for follow up therapy are one component of a multi-disciplinary discharge planning process, led by the attending physician.  Recommendations may be updated based on patient status, additional functional criteria and insurance authorization. ?   ?Follow Up Recommendations ? Follow physician's recommendations for discharge plan and follow up therapies  ?  ?Assistance Recommended at Discharge Intermittent Supervision/Assistance  ?Patient can return home with the following ? A lot of help with walking and/or transfers;A lot of help with bathing/dressing/bathroom;Assistance with cooking/housework;Help with stairs or ramp for entrance;Assist for transportation ?  ?Equipment Recommendations ? BSC/3in1  ?  ?Recommendations for Other Services PT consult ? ?  ?Precautions / Restrictions Precautions ?Precautions: Fall;Other (comment) ?Precaution Comments: watch BP, O2 ?Restrictions ?Weight Bearing Restrictions: Yes ?LLE Weight Bearing: Weight bearing as tolerated  ? ? ?  ? ?Mobility Bed Mobility ?  ?  ?  ?  ?  ?  ?  ?General bed mobility  comments: up in chair upon arrival/returned to chair at end of session ?  ? ?Transfers ?Overall transfer level: Needs assistance ?Equipment used: Rolling walker (2 wheels) ?Transfers: Sit to/from Stand ?Sit to Stand: Min assist, Mod assist ?  ?  ?  ?  ?  ?  ?  ?  ?Balance Overall balance assessment: Needs assistance ?Sitting-balance support: Feet supported ?Sitting balance-Leahy Scale: Fair ?  ?  ?Standing balance support: Bilateral upper extremity supported, Reliant on assistive device for balance, During functional activity ?Standing balance-Leahy Scale: Poor ?Standing balance comment: requires support of walker and assisting her to stand fully upright ?  ?  ?  ?  ?  ?  ?  ?  ?  ?  ?  ?   ? ?ADL either performed or assessed with clinical judgement  ? ?ADL Overall ADL's : Needs assistance/impaired ?  ?  ?Grooming: Wash/dry hands;Standing;Min guard ?Grooming Details (indicate cue type and reason): completed standing at sink ?  ?  ?  ?  ?Upper Body Dressing : Standing;Min guard ?Upper Body Dressing Details (indicate cue type and reason): to don gown on backside ?Lower Body Dressing: Maximal assistance;Sitting/lateral leans ?Lower Body Dressing Details (indicate cue type and reason): to don socks ?Toilet Transfer: Min guard;Rolling walker (2 wheels);Comfort height toilet ?  ?Toileting- Clothing Manipulation and Hygiene: Supervision/safety;Sitting/lateral lean ?Toileting - Clothing Manipulation Details (indicate cue type and reason): performed toilet hygiene ?  ?  ?Functional mobility during ADLs: Min guard;Rolling walker (2 wheels);Cueing for sequencing;Cueing for safety ?  ?  ? ?Extremity/Trunk Assessment Upper Extremity Assessment ?Upper Extremity Assessment: Generalized weakness ?  ?Lower Extremity Assessment ?Lower Extremity Assessment: Defer to PT evaluation ?  ?  ?  ? ?  Vision   ?Vision Assessment?: No apparent visual deficits ?  ?Perception Perception ?Perception: Not tested ?  ?Praxis Praxis ?Praxis: Not  tested ?  ? ?Cognition Arousal/Alertness: Awake/alert ?Behavior During Therapy: [facility] for tasks assessed/performed ?Overall Cognitive Status: Within Functional Limits for tasks assessed ?  ?  ?  ?  ?  ?  ?  ?  ?  ?  ?  ?  ?  ?  ?  ?  ?  ?  ?  ?   ?Exercises   ? ?  ?Shoulder Instructions   ? ? ?  ?General Comments SpO2 reading in 90's with hallway ambulation for ~3 mins on RA, dropping into low 80's once seated in chair at end of session. RN notified, advised may want to attempt another O2 walk test. Pt SpO2 back up into mid-high 90's on 4L O2 at end of session  ? ? ?Pertinent Vitals/ Pain       Pain Assessment ?Pain Assessment: No/denies pain ? ?Home Living   ?  ?  ?  ?  ?  ?  ?  ?  ?  ?  ?  ?  ?  ?  ?  ?  ?  ?  ? ?  ?Prior Functioning/Environment    ?  ?  ?  ?   ? ?Frequency ? Min 3X/week  ? ? ? ? ?  ?Progress Toward Goals ? ?OT Goals(current goals can now be found in the care plan section) ? Progress towards OT goals: Progressing toward goals ? ?Acute Rehab OT Goals ?Patient Stated Goal: to get better ?OT Goal Formulation: With patient ?Time For Goal Achievement: 04/16/22 ?Potential to Achieve Goals: Good ?ADL Goals ?Pt Will Perform Upper Body Dressing: with supervision;sitting ?Pt Will Perform Lower Body Dressing: with mod assist;sitting/lateral leans;with adaptive equipment ?Pt Will Transfer to Toilet: with supervision;ambulating;regular height toilet ?Pt Will Perform Tub/Shower Transfer: Tub transfer;Shower transfer;with min assist;rolling walker;ambulating  ?Plan Frequency remains appropriate;Discharge plan needs to be updated   ? ?Co-evaluation ? ? ?   ?  ?  ?  ?  ? ?  ?AM-PAC OT "6 Clicks" Daily Activity     ?Outcome Measure ? ? Help from another person eating meals?: None ?Help from another person taking care of personal grooming?: A Little ?Help from another person toileting, which includes using toliet, bedpan, or urinal?: A Little ?Help from another person bathing (including washing, rinsing, drying)?: A  Lot ?Help from another person to put on and taking off regular upper body clothing?: A Little ?Help from another person to put on and taking off regular lower body clothing?: A Lot ?6 Click Score: 17 ? ?  ?End of Session Equipment Utilized During Treatment: Gait belt;Rolling walker (2 wheels);Oxygen ? ?OT Visit Diagnosis: Unsteadiness on feet (R26.81);Other abnormalities of gait and mobility (R26.89);Muscle weakness (generalized) (M62.81);Pain ?Pain - Right/Left: Left ?Pain - part of body: Hip ?  ?Activity Tolerance Patient tolerated treatment well ?  ?Patient Left in chair;with call bell/phone within reach;with chair alarm set ?  ?Nurse Communication Mobility status ?  ? ?   ? ?Time: 6384-6659 ?OT Time Calculation (min): 26 min ? ?Charges: OT General Charges ?$OT Visit: 1 Visit ?OT Treatments ?$Self Care/Home Management : 8-22 mins ?$Therapeutic Activity: 8-22 mins ? ?Lynnda Child, OTD, OTR/L ?Acute Rehab ?[phone] ? ? ?Kaylyn Lim ?04/07/2022, 3:51 PM ?

## 2022-04-07 NOTE — Plan of Care (Signed)

## 2022-04-07 NOTE — Progress Notes (Signed)
Mobility Specialist Progress Note  ? ? 04/07/22 1519  ?Mobility  ?Activity Ambulated with assistance in hallway  ?Level of Assistance +2 (takes two people) ?(chair follow)  ?Assistive Device Front wheel walker  ?LLE Weight Bearing WBAT  ?Distance Ambulated (ft) 80 ft  ?Activity Response Tolerated well  ?$Mobility charge 1 Mobility  ? ?Pre-Mobility: 92% on RA SpO2 ?During Mobility: 92% on 1L SpO2 ? ?Pt received in chair and agreeable.No complaints. Encouraged pursed lip breathing, needed 1LO2. Left in chair with RN present.  ? ? ?Hildred Alamin ?Mobility Specialist  ?  ?

## 2022-04-08 DIAGNOSIS — I10 Essential (primary) hypertension: Secondary | ICD-10-CM | POA: Diagnosis not present

## 2022-04-08 DIAGNOSIS — J9601 Acute respiratory failure with hypoxia: Secondary | ICD-10-CM | POA: Diagnosis not present

## 2022-04-08 LAB — CBC WITH DIFFERENTIAL/PLATELET
Abs Immature Granulocytes: 0.1 10*3/uL — ABNORMAL HIGH (ref 0.00–0.07)
Basophils Absolute: 0.1 10*3/uL (ref 0.0–0.1)
Basophils Relative: 1 %
Eosinophils Absolute: 0.9 10*3/uL — ABNORMAL HIGH (ref 0.0–0.5)
Eosinophils Relative: 10 %
HCT: 32.1 % — ABNORMAL LOW (ref 36.0–46.0)
Hemoglobin: 10.1 g/dL — ABNORMAL LOW (ref 12.0–15.0)
Immature Granulocytes: 1 %
Lymphocytes Relative: 12 %
Lymphs Abs: 1.2 10*3/uL (ref 0.7–4.0)
MCH: 26 pg (ref 26.0–34.0)
MCHC: 31.5 g/dL (ref 30.0–36.0)
MCV: 82.5 fL (ref 80.0–100.0)
Monocytes Absolute: 0.9 10*3/uL (ref 0.1–1.0)
Monocytes Relative: 9 %
Neutro Abs: 6.7 10*3/uL (ref 1.7–7.7)
Neutrophils Relative %: 67 %
Platelets: 238 10*3/uL (ref 150–400)
RBC: 3.89 MIL/uL (ref 3.87–5.11)
RDW: 16.1 % — ABNORMAL HIGH (ref 11.5–15.5)
WBC: 9.8 10*3/uL (ref 4.0–10.5)
nRBC: 0 % (ref 0.0–0.2)

## 2022-04-08 LAB — BASIC METABOLIC PANEL
Anion gap: 6 (ref 5–15)
BUN: 12 mg/dL (ref 8–23)
CO2: 39 mmol/L — ABNORMAL HIGH (ref 22–32)
Calcium: 9.3 mg/dL (ref 8.9–10.3)
Chloride: 93 mmol/L — ABNORMAL LOW (ref 98–111)
Creatinine, Ser: 0.71 mg/dL (ref 0.44–1.00)
GFR, Estimated: >60 mL/min (ref >60)
Glucose, Bld: 105 mg/dL — ABNORMAL HIGH (ref 70–99)
Potassium: 3 mmol/L — ABNORMAL LOW (ref 3.5–5.1)
Sodium: 138 mmol/L (ref 135–145)

## 2022-04-08 LAB — MAGNESIUM: Magnesium: 1.6 mg/dL — ABNORMAL LOW (ref 1.7–2.4)

## 2022-04-08 MED ORDER — BISACODYL 10 MG RE SUPP
10.0000 mg | Freq: Every day | RECTAL | Status: DC | PRN
  Filled 2022-04-08: qty 1

## 2022-04-08 MED ORDER — FERROUS SULFATE 325 (65 FE) MG PO TABS: 325.0000 mg | ORAL_TABLET | Freq: Two times a day (BID) | ORAL | 3 refills | Status: AC

## 2022-04-08 MED ORDER — FERROUS SULFATE 325 (65 FE) MG PO TABS
325.0000 mg | ORAL_TABLET | Freq: Two times a day (BID) | ORAL | Status: DC
  Administered 2022-04-08: 325 mg via ORAL
  Filled 2022-04-08: qty 1

## 2022-04-08 MED ORDER — SENNOSIDES-DOCUSATE SODIUM 8.6-50 MG PO TABS: 2.0000 | ORAL_TABLET | Freq: Two times a day (BID) | ORAL | 0 refills | Status: AC

## 2022-04-08 MED ORDER — POLYETHYLENE GLYCOL 3350 17 G PO PACK
17.0000 g | PACK | Freq: Every day | ORAL | Status: DC
  Administered 2022-04-08: 17 g via ORAL
  Filled 2022-04-08: qty 1

## 2022-04-08 MED ORDER — SENNOSIDES-DOCUSATE SODIUM 8.6-50 MG PO TABS: 2.0000 | ORAL_TABLET | Freq: Two times a day (BID) | ORAL | Status: DC

## 2022-04-08 MED ORDER — POTASSIUM CHLORIDE CRYS ER 20 MEQ PO TBCR
40.0000 meq | EXTENDED_RELEASE_TABLET | Freq: Two times a day (BID) | ORAL | Status: DC
  Administered 2022-04-08: 40 meq via ORAL
  Filled 2022-04-08: qty 2

## 2022-04-08 MED ORDER — POTASSIUM & SODIUM PHOSPHATES 280-160-250 MG PO PACK
1.0000 | PACK | Freq: Three times a day (TID) | ORAL | Status: DC
  Filled 2022-04-08 (×5): qty 1

## 2022-04-08 MED ORDER — MAGNESIUM OXIDE -MG SUPPLEMENT 400 (240 MG) MG PO TABS: 400.0000 mg | ORAL_TABLET | Freq: Two times a day (BID) | ORAL | 0 refills | Status: AC

## 2022-04-08 MED ORDER — MAGNESIUM OXIDE -MG SUPPLEMENT 400 (240 MG) MG PO TABS: 400.0000 mg | ORAL_TABLET | Freq: Two times a day (BID) | ORAL | Status: DC

## 2022-04-08 MED ORDER — POLYETHYLENE GLYCOL 3350 17 G PO PACK: 17.0000 g | PACK | Freq: Every day | ORAL | 0 refills | Status: AC | PRN

## 2022-04-08 MED ORDER — POTASSIUM & SODIUM PHOSPHATES 280-160-250 MG PO PACK: 1.0000 | PACK | Freq: Three times a day (TID) | ORAL | 0 refills | Status: AC

## 2022-04-08 NOTE — Progress Notes (Signed)
SATURATION QUALIFICATIONS: (This note is used to comply with regulatory documentation for home oxygen) ? ?Patient Saturations on Room Air at Rest = 97% ? ?Patient Saturations on Room Air while Ambulating = 92% ? ?Patient Saturations on 0 Liters of oxygen while Ambulating = 92% ? ?Please briefly explain why patient needs home oxygen: Patient does not need home O2 ?

## 2022-04-08 NOTE — Progress Notes (Addendum)
? ? ? [facility] ?                                                                            ? ? ?Meghan Miller, is a 86 y.o. female, DOB - 26-Aug-1936, HBZ:[id number] ?Admit date - 04/01/2022    ?Outpatient Primary MD for the patient is Meghan Burly, MD ? ?LOS - 7  days ? ? ? ?Brief summary  ? ?86 year old F with history hip arthritis, HTN, dyspnea, former smoker and obesity admitted by orthopedic surgery team for chronic hip arthritis.  She underwent left total arthroplasty on 04/01/2022.  She had postoperative blood loss anemia with hemoglobin dropping from 12-9.1.  EBL 800 cc.  She had 1 unit on 4/5 but hemoglobin trended down to 8.3 this morning.  She had another 1 unit.  She was on 2 different IV fluids.  She was also requiring supplemental oxygen.  She was ambulated this afternoon and maintain appropriate saturation but she desaturated to upper 80s after she sat down and was not able to recover above 88%.  Hospitalist service consulted due to acute respiratory failure with hypoxia. ?Her respiratory status improving. Check ambulating oxygen levels.  ? ?Assessment & Plan  ? ? ?Assessment and Plan: ?* Primary osteoarthritis of left hip ?S/p left total arthroplasty by Dr. Lorin Miller.  ?-Management per primary.  ? ?Acute respiratory failure with hypoxia ([place]) ?Likely due to anemia and atelectasis.   She appears slightly fluid overloaded.  ?No history of COPD or CHF but she has 25-pack-year history and quit smoking in 1991.  She has elevated D-dimer but low suspicion for VTE. ?Bnp elevated, but CXR does not show any acute pneumonia or fluid.  ?Check ambulating oxygen levels and plan to wean her off oxygen on discharge.  ?-Encourage incentive spirometry/OOB/PT/OT ?-Trial of DuoNebs as needed. ?-Treat anemia ? ?Goals of care, counseling/discussion ?Discussed CODE STATUS including pros and cons of CPR and intubation.  Patient stated that she is DNR/DNI. ?-CODE STATUS changed to DNR/DNI ? ?Acute postoperative  anemia due to expected blood loss ?Recent Labs  ?  01/10/22 ?1347 03/25/22 ?1350 04/02/22 ?0148 04/03/22 ?0325 04/04/22 ?1412  ?HGB 11.8* 12.3 9.1* 8.3* 9.9*  ?Patient had an EBL of 800 cc documented.  Denies melena or hematochezia.   ?Transfused 2 units of prbc transfusion. Hemoglobin remains stable. Check cbc in am.  ?Anemia panel shows low iron levels, recommend iron supplementation on discharge.  ? ? ? ?Elevated d-dimer ?This is unreliable postop.  Patient is already on full dose aspirin for VTE prophylaxis.  She has no calf tenderness ?-No further work-up ? ? ? ?Essential hypertension ?BP parameters slightly elevated, restart home meds benazapril HCTZ on discharge.  ?Stop the amlodipine.  ? ?Class II obesity ?Body mass index is 38.11 kg/m?. ?Encourage lifestyle change to lose weight. ? ? ?Leukocytosis: ?Resolved.  ? ? ?Hypomagnesemia: replaced.  ? ?Pt medically stable for discharge.  ?  ? ?Estimated body mass index is 38.11 kg/m? as calculated from the following: ?  Height as of this encounter: '5\' 5"'$  (1.651 m). ?  Weight as of this encounter: 103.9 kg. ? ?Code Status: DNR ?DVT Prophylaxis:  SCDs Start: 04/01/22 1639 ?Place TED hose  Start: 04/01/22 1639 ? ? ? ?Antimicrobials:  ? ?Anti-infectives (From admission, onward)  ? ? Start     Dose/Rate Route Frequency Ordered Stop  ? 04/01/22 1100  vancomycin (VANCOCIN) IVPB 1000 mg/200 mL premix       ? 1,000 mg ?200 mL/hr over 60 Minutes Intravenous On call to O.R. 04/01/22 1052 04/01/22 1258  ? ?  ? ? ? ?Medications ? ?Scheduled Meds: ? aspirin EC  325 mg Oral Q breakfast  ? ferrous sulfate  325 mg Oral BID WC  ? fesoterodine  4 mg Oral Daily  ? fluticasone  1 spray Each Nare Daily  ? furosemide  20 mg Oral Daily  ? polyethylene glycol  17 g Oral Daily  ? potassium & sodium phosphates  1 packet Oral TID WC & HS  ? potassium chloride  40 mEq Oral BID  ? senna-docusate  2 tablet Oral BID  ? ?Continuous Infusions: ? methocarbamol (ROBAXIN) IV    ? ?PRN  Meds:.acetaminophen, bisacodyl, hydrALAZINE, HYDROcodone-acetaminophen, menthol-cetylpyridinium **OR** phenol, methocarbamol **OR** methocarbamol (ROBAXIN) IV, metoCLOPramide **OR** metoCLOPramide (REGLAN) injection, ondansetron **OR** ondansetron (ZOFRAN) IV ? ? ? ?Subjective:  ? ?Meghan Miller was seen and examined today.  No new complaints.  ? ?Objective:  ? ?Vitals:  ? 04/07/22 1633 04/07/22 1733 04/07/22 2029 04/08/22 0253  ?BP:   139/68 (!) 154/75  ?Pulse: 84 82 92 87  ?Resp:   20 20  ?Temp:   98.1 ?F (36.7 ?C) 97.9 ?F (36.6 ?C)  ?TempSrc:      ?SpO2: 95% 97% 92% 95%  ?Weight:      ?Height:      ? ? ?Intake/Output Summary (Last 24 hours) at 04/08/2022 1225 ?Last data filed at 04/07/2022 1605 ?Gross per 24 hour  ?Intake 720 ml  ?Output 150 ml  ?Net 570 ml  ? ? ?Filed Weights  ? 04/01/22 1108  ?Weight: 103.9 kg  ? ? ? ?Exam ?General exam: Appears calm and comfortable  ?Respiratory system: Clear to auscultation. Respiratory effort normal. On 1 lit of [place] oxygen.  ?Cardiovascular system: S1 & S2 heard, RRR. No pedal edema. ?Gastrointestinal system: Abdomen is nondistended, soft and nontender. . Normal bowel sounds heard. ?Central nervous system: Alert and oriented. No focal neurological deficits. ?Extremities: Symmetric 5 x 5 power. ?Skin: No rashes, lesions or ulcers ?Psychiatry: Mood & affect appropriate.  ? ? ? ? ? ?Data Reviewed:  I have personally reviewed following labs and imaging studies ? ? ?CBC ?Lab Results  ?Component Value Date  ? WBC 9.8 04/08/2022  ? RBC 3.89 04/08/2022  ? HGB 10.1 (L) 04/08/2022  ? HCT 32.1 (L) 04/08/2022  ? MCV 82.5 04/08/2022  ? MCH 26.0 04/08/2022  ? PLT 238 04/08/2022  ? MCHC 31.5 04/08/2022  ? RDW 16.1 (H) 04/08/2022  ? LYMPHSABS 1.2 04/08/2022  ? MONOABS 0.9 04/08/2022  ? EOSABS 0.9 (H) 04/08/2022  ? BASOSABS 0.1 04/08/2022  ? ? ? ?Last metabolic panel ?Lab Results  ?Component Value Date  ? NA 138 04/08/2022  ? K 3.0 (L) 04/08/2022  ? CL 93 (L) 04/08/2022  ? CO2 39 (H)  04/08/2022  ? BUN 12 04/08/2022  ? CREATININE 0.71 04/08/2022  ? GLUCOSE 105 (H) 04/08/2022  ? GFRNONAA >60 04/08/2022  ? CALCIUM 9.3 04/08/2022  ? PHOS 2.3 (L) 04/05/2022  ? PROT 7.6 03/25/2022  ? ALBUMIN 2.8 (L) 04/05/2022  ? BILITOT 0.9 03/25/2022  ? ALKPHOS 64 03/25/2022  ? AST 24 03/25/2022  ? ALT 13 03/25/2022  ?  ANIONGAP 6 04/08/2022  ? ? ?CBG (last 3)  ?No results for input(s): GLUCAP in the last 72 hours.  ? ? ?Coagulation Profile: ?No results for input(s): INR, PROTIME in the last 168 hours. ? ? ?Radiology Studies: ?No results found. ? ? ? ? ?Hosie Poisson M.D. ?[facility] ?04/08/2022, 12:25 PM ? ?Available via Epic secure chat 7am-7pm ?After 7 pm, please refer to night coverage provider listed on amion. ? ? ? ?

## 2022-04-08 NOTE — Progress Notes (Signed)
Physical Therapy Treatment ?Patient Details ?Name: Meghan Miller ?MRN: [id number] ?DOB: 15-Feb-1936 ?Today's Date: 04/08/2022 ? ? ?History of Present Illness 86 y.o. F who presents s/p L THA, direct anterior 04/01/2022. Significant PMH: skin CA, PVD, bilateral TKA's. ? ?  ?PT Comments  ? ? Pt was seen after being somewhat fatigued to get ready to go home.  Had O2 sats of 91% baseline, with standing and wgt shifting at 4 minutes down to 86% on room air, recovered to 91% in a couple minutes.  Pt then walked 39' with room air, again dropped to 86% and required recovery of sats.  PT could not get pt to follow the instructions for pursed lip breathing so was down for a couple minutes to get recovery finally.  Follow acute PT goals for recovery of sats, for strengthening and to improve quality and safety of gait.  Follow up to get rehab care as ordered by MD.   ?Recommendations for follow up therapy are one component of a multi-disciplinary discharge planning process, led by the attending physician.  Recommendations may be updated based on patient status, additional functional criteria and insurance authorization. ? ?Follow Up Recommendations ? Follow physician's recommendations for discharge plan and follow up therapies ?  ?  ?Assistance Recommended at Discharge PRN  ?Patient can return home with the following A little help with walking and/or transfers;A little help with bathing/dressing/bathroom;Assistance with cooking/housework;Assist for transportation;Help with stairs or ramp for entrance ?  ?Equipment Recommendations ? Rolling walker (2 wheels);BSC/3in1  ?  ?Recommendations for Other Services   ? ? ?  ?Precautions / Restrictions Precautions ?Precautions: Fall;Other (comment) ?Precaution Comments: watch BP, O2 ?Restrictions ?Weight Bearing Restrictions: No ?LLE Weight Bearing: Weight bearing as tolerated  ?  ? ?Mobility ? Bed Mobility ?Overal bed mobility: Needs Assistance ?  ?  ?  ?  ?  ?  ?General bed mobility  comments: up in chair when PT arrived ?  ? ?Transfers ?Overall transfer level: Needs assistance ?Equipment used: Rolling walker (2 wheels) ?Transfers: Sit to/from Stand ?Sit to Stand: Min guard ?  ?Step pivot transfers: Min guard ?  ?  ?  ?General transfer comment: stood 4 minutes during time pt was dealing with trying to dress for home and to use BSC ?  ? ?Ambulation/Gait ?Ambulation/Gait assistance: Min guard ?Gait Distance (Feet): 45 Feet ?Assistive device: Rolling walker (2 wheels) ?Gait Pattern/deviations: Step-to pattern, Step-through pattern, Decreased stride length ?Gait velocity: decreased ?Gait velocity interpretation: <1.31 ft/sec, indicative of household ambulator ?Pre-gait activities: standing balance ck ?General Gait Details: assisted to supervise with standing ? ? ?Stairs ?  ?  ?  ?  ?  ? ? ?Wheelchair Mobility ?  ? ?Modified Rankin (Stroke Patients Only) ?  ? ? ?  ?Balance Overall balance assessment: Needs assistance ?Sitting-balance support: Feet supported ?Sitting balance-Leahy Scale: Good ?  ?  ?  ?Standing balance-Leahy Scale: Fair ?Standing balance comment: less than fair with dynamic balance tasks ?  ?  ?  ?  ?  ?  ?  ?  ?  ?  ?  ?  ? ?  ?Cognition Arousal/Alertness: Awake/alert ?Behavior During Therapy: [facility] for tasks assessed/performed ?Overall Cognitive Status: Within Functional Limits for tasks assessed ?  ?  ?  ?  ?  ?  ?  ?  ?  ?  ?  ?  ?  ?  ?  ?  ?  ?  ?  ? ?  ?Exercises   ? ?  ?  General Comments General comments (skin integrity, edema, etc.): Pt is up to side of bed and chair, onto and off BSC with HHA on arms of chair and with extra time for edema on RLE thigh ?  ?  ? ?Pertinent Vitals/Pain Pain Assessment ?Pain Assessment: Faces ?Faces Pain Scale: Hurts little more ?Breathing: normal ?Negative Vocalization: none ?Facial Expression: smiling or inexpressive ?Body Language: relaxed ?Consolability: no need to console ?PAINAD Score: 0 ?Pain Location: L Hip with mobility ?Pain Descriptors /  Indicators: Guarding, Heaviness ?Pain Intervention(s): Limited activity within patient's tolerance, Monitored during session, Premedicated before session, Repositioned  ? ? ?Home Living   ?  ?  ?  ?  ?  ?  ?  ?  ?  ?   ?  ?Prior Function    ?  ?  ?   ? ?PT Goals (current goals can now be found in the care plan section) Acute Rehab PT Goals ?Patient Stated Goal: to get home ?Progress towards PT goals: Progressing toward goals ? ?  ?Frequency ? ? ? Min 5X/week ? ? ? ?  ?PT Plan Current plan remains appropriate;Frequency needs to be updated  ? ? ?Co-evaluation   ?  ?  ?  ?  ? ?  ?AM-PAC PT "6 Clicks" Mobility   ?Outcome Measure ? Help needed turning from your back to your side while in a flat bed without using bedrails?: A Little ?Help needed moving from lying on your back to sitting on the side of a flat bed without using bedrails?: A Little ?Help needed moving to and from a bed to a chair (including a wheelchair)?: A Little ?Help needed standing up from a chair using your arms (e.g., wheelchair or bedside chair)?: A Little ?Help needed to walk in hospital room?: A Little ?Help needed climbing 3-5 steps with a railing? : A Lot ?6 Click Score: 17 ? ?  ?End of Session Equipment Utilized During Treatment: Gait belt;Oxygen ?Activity Tolerance: Patient limited by fatigue ?Patient left: in chair;with call bell/phone within reach;with chair alarm set ?Nurse Communication: Mobility status ?PT Visit Diagnosis: Unsteadiness on feet (R26.81);Other abnormalities of gait and mobility (R26.89);Difficulty in walking, not elsewhere classified (R26.2);Pain ?Pain - Right/Left: Left ?Pain - part of body: Hip ?  ? ? ?Time: 1543-1600 ?PT Time Calculation (min) (ACUTE ONLY): 17 min ? ?Charges:  $Gait Training: 8-22 mins         ?Ramond Dial ?04/08/2022, 5:31 PM ? ?Mee Hives, PT PhD ?Acute Rehab Dept. Number: [facility] [phone] and [place] [phone] ? ? ?

## 2022-04-08 NOTE — TOC Progression Note (Addendum)
Transition of Care (TOC) - Progression Note  ? ? ?Patient Details  ?Name: Meghan Miller ?MRN: [id number] ?Date of Birth: 1936/09/15 ? ?Transition of Care (TOC) CM/SW Contact  ?Joanne Chars, LCSW ?Phone Number: ?04/08/2022, 1:04 PM ? ?Clinical Narrative:   CSW spoke with pt regarding DC plan for [facility].  Pt states plan has changed, she spoke with Dr Lorin Mercy this AM and is now planning to DC home.  CSW spoke with pt son Elta Guadeloupe who has talked with pt and said he and his brother are able to provide needed assistance at home, he supports pt decision to not go to SNF.  MDs both informed.  Plan now for home with [facility].  RMCM also informed, as was Ebony Hail at [facility].  ? ? ? ?Expected Discharge Plan: Home/Self Care ?Barriers to Discharge: Continued Medical Work up ? ?Expected Discharge Plan and Services ?Expected Discharge Plan: Home/Self Care ?  ?Discharge Planning Services: CM Consult ?  ?  ?Expected Discharge Date: 04/08/22               ?DME Arranged: 3-N-1, Walker rolling ?DME Agency: Other - Comment ([place]) ?Date DME Agency Contacted: 04/03/22 ?Time DME Agency Contacted: [phone] ?Representative spoke with at DME Agency: Brenton Grills ?  ?  ?  ?  ?  ? ? ?Social Determinants of Health (SDOH) Interventions ?  ? ?Readmission Risk Interventions ?   ? View : No data to display.  ?  ?  ?  ? ? ?

## 2022-04-08 NOTE — Progress Notes (Signed)
PT Cancellation Note ? ?Patient Details ?Name: Meghan Miller ?MRN: [id number] ?DOB: [birth year] ? ? ?Cancelled Treatment:    Reason Eval/Treat Not Completed: Other (comment).  Declined since she is leaving and encouraged her to let nursing know if she changes her mind. ? ? ?Ramond Dial ?04/08/2022, 12:20 PM ? ?Mee Hives, PT PhD ?Acute Rehab Dept. Number: [facility] [phone] and [place] [phone] ? ?

## 2022-04-08 NOTE — Plan of Care (Signed)

## 2022-04-08 NOTE — Progress Notes (Addendum)
Patient ID: Meghan Miller, female   DOB: 1936-08-11, 86 y.o.   MRN: [id number] ? ? ?Subjective: ?7 Days Post-Op Procedure(s) (LRB): ?LEFT TOTAL HIP ARTHROPLASTY ANTERIOR APPROACH (Left) ?Patient reports pain as mild and moderate.  C/o constipation. Got suppository and something oral to help . On bedside commode.  ? ?Objective: ?Vital signs in last 24 hours: ?Temp:  [97.9 ?F (36.6 ?C)-98.2 ?F (36.8 ?C)] 97.9 ?F (36.6 ?C) (04/10 0253) ?Pulse Rate:  [82-92] 87 (04/10 0253) ?Resp:  [20] 20 (04/10 0253) ?BP: (139-154)/(66-75) 154/75 (04/10 0253) ?SpO2:  [92 %-97 %] 95 % (04/10 0253) ? ?Intake/Output from previous day: ?04/09 0701 - 04/10 0700 ?In: 960 [P.O.:960] ?Out: 150 [Urine:150] ?Intake/Output this shift: ?No intake/output data recorded. ? ?Recent Labs  ?  04/08/22 ?0010  ?HGB 10.1*  ? ?Recent Labs  ?  04/08/22 ?0010  ?WBC 9.8  ?RBC 3.89  ?HCT 32.1*  ?PLT 238  ? ?Recent Labs  ?  04/08/22 ?0010  ?NA 138  ?K 3.0*  ?CL 93*  ?CO2 39*  ?BUN 12  ?CREATININE 0.71  ?GLUCOSE 105*  ?CALCIUM 9.3  ? ?No results for input(s): LABPT, INR in the last 72 hours. ? ?Neurologically intact ?No results found. ? ?Assessment/Plan: ?7 Days Post-Op Procedure(s) (LRB): ?LEFT TOTAL HIP ARTHROPLASTY ANTERIOR APPROACH (Left) ?Up with therapy, home this afternoon if she can reach her son. Daily walking no HHPT needed.  . Office one  week ? ?[name] ?04/08/2022, 9:59 AM ? ? ? ? ?\ ?

## 2022-04-11 ENCOUNTER — Ambulatory Visit (INDEPENDENT_AMBULATORY_CARE_PROVIDER_SITE_OTHER): Payer: Federal, State, Local not specified - PPO | Admitting: Orthopaedic Surgery

## 2022-04-11 ENCOUNTER — Encounter: Payer: Self-pay | Admitting: Orthopaedic Surgery

## 2022-04-11 ENCOUNTER — Telehealth: Payer: Self-pay | Admitting: Orthopaedic Surgery

## 2022-04-11 ENCOUNTER — Ambulatory Visit: Payer: Self-pay

## 2022-04-11 VITALS — Ht 65.0 in | Wt 229.0 lb

## 2022-04-11 DIAGNOSIS — Z96642 Presence of left artificial hip joint: Secondary | ICD-10-CM

## 2022-04-11 NOTE — Telephone Encounter (Signed)
Received callfrom Jerolyn Center (PT) with [facility] advised there is a delay of care because patient refused all start of care until Monday. Annie Main asked for verbal orders stating it is ok ti start [place] on Monday. The number to contact Annie Main is [phone]  ?

## 2022-04-11 NOTE — Telephone Encounter (Signed)
Please advise 

## 2022-04-11 NOTE — Progress Notes (Signed)
? ?  Post-Op Visit Note ?  ?Patient: Meghan Miller           ?Date of Birth: 01-Jun-1936           ?MRN: [id number] ?Visit Date: 04/11/2022 ?PCP: Neale Burly, MD ? ? ?Assessment & Plan post left total hip arthroplasty 10 days out.  She is Dealer with a walker.  She had an episode when she twisted her leg when she was entering or exiting a car. ? ?Chief Complaint:  ?Chief Complaint  ?Patient presents with  ? Left Hip - Routine Post Op  ?  04/01/2022 Left THA  ? ?Visit Diagnoses:  ?1. Status post total replacement of left hip   ? ? ?Plan: X-rays look good staples are harvested Steri-Strips applied recheck 5 weeks. ? ?Follow-Up Instructions: No follow-ups on file.  ? ?Orders:  ?Orders Placed This Encounter  ?Procedures  ? XR HIP UNILAT W OR W/O PELVIS 2-3 VIEWS LEFT  ? ?No orders of the defined types were placed in this encounter. ? ? ?Imaging: ?No results found. ? ?PMFS History: ?Patient Active Problem List  ? Diagnosis Date Noted  ? Acute respiratory failure with hypoxia ([place]) 04/04/2022  ? Essential hypertension 04/04/2022  ? Goals of care, counseling/discussion 04/04/2022  ? Bandemia 04/04/2022  ? Elevated d-dimer 04/04/2022  ? Class II obesity 04/04/2022  ? Acute postoperative anemia due to expected blood loss 04/03/2022  ? Primary osteoarthritis of left hip 04/01/2022  ? Unilateral primary osteoarthritis, left hip 11/29/2021  ? ?Past Medical History:  ?Diagnosis Date  ? Anemia   ? "i used to have this all the time"  ? Arthritis   ? Cancer [facility])   ? skin cancer- pt has had 2 spots removed from face/ear area (2020 and 2022)  ? Dyspnea   ? Hypertension   ? Peripheral vascular disease ([place])   ? "blockages in my legs"- "valves not good"- causes leg swelling per patient  ?  ?No family history on file.  ?Past Surgical History:  ?Procedure Laterality Date  ? ABDOMINAL HYSTERECTOMY    ? APPENDECTOMY    ? appendix removed with tubal ligation  ? bladder prolapse    ? BREAST SURGERY    ? "couple of breast biopsies" on left  side per patient  ? CARDIAC CATHETERIZATION    ? between 1985-1995. Pt states she had "some blockage in one artery but there was no concern"  ? CATARACT EXTRACTION Bilateral   ? "years ago"  ? CHOLECYSTECTOMY    ? EYE SURGERY Bilateral 1985  ? cataract extraction  ? JOINT REPLACEMENT    ? both knees replaced in 1991  ? KNEE SURGERY Left 1996  ? revision  ? TONSILLECTOMY    ? removed as a child  ? TOTAL HIP ARTHROPLASTY Left 04/01/2022  ? Procedure: LEFT TOTAL HIP ARTHROPLASTY ANTERIOR APPROACH;  Surgeon: [name], MD;  Location: [place];  Service: Orthopedics;  Laterality: Left;  ? TUBAL LIGATION  1970  ? ?Social History  ? ?Occupational History  ? Not on file  ?Tobacco Use  ? Smoking status: Former  ?  Types: Cigarettes  ?  Quit date: 1989  ?  Years since quitting: 34.3  ? Smokeless tobacco: Never  ?Vaping Use  ? Vaping Use: Never used  ?Substance and Sexual Activity  ? Alcohol use: Yes  ?  Comment: occasionally-[place] when in [place]  ? Drug use: Never  ? Sexual activity: Not on file  ? ? ? ?

## 2022-04-11 NOTE — Telephone Encounter (Signed)
I called, no answer. Voicemail does not state who phone belongs to, no message left. Will try again. ?

## 2022-04-12 NOTE — Telephone Encounter (Signed)
I called, no answer, voicemail picks up. No message left. ?Will wait for return call. ?

## 2022-04-15 NOTE — Telephone Encounter (Signed)
I spoke with Meghan Miller and advised. ?

## 2022-04-25 ENCOUNTER — Encounter: Payer: Self-pay | Admitting: Orthopaedic Surgery

## 2022-04-25 ENCOUNTER — Ambulatory Visit (INDEPENDENT_AMBULATORY_CARE_PROVIDER_SITE_OTHER): Payer: Federal, State, Local not specified - PPO | Admitting: Orthopaedic Surgery

## 2022-04-25 DIAGNOSIS — Z96642 Presence of left artificial hip joint: Secondary | ICD-10-CM

## 2022-04-25 DIAGNOSIS — S7002XA Contusion of left hip, initial encounter: Secondary | ICD-10-CM | POA: Insufficient documentation

## 2022-04-25 MED ORDER — CEPHALEXIN 500 MG PO CAPS: 500.0000 mg | ORAL_CAPSULE | Freq: Two times a day (BID) | ORAL | 1 refills | Status: DC

## 2022-04-25 NOTE — Progress Notes (Signed)
? ?Post-Op Visit Note ?  ?Patient: Meghan Miller           ?Date of Birth: 04-16-1936           ?MRN: [id number] ?Visit Date: 04/25/2022 ?PCP: Neale Burly, MD ? ? ?Assessment & Plan: Patient had staples removed left hip 1 week ago.  She then noted some swelling in her hip region she is on aspirin and then had pinpoint area of bloody drainage that took several hours to stop.  Happened again last night and there is a pinpoint spot for bloody drainage is able to be expressed no purulence no cellulitis no temperature no fever.  We will place her on some Keflex recheck in a week if she is continuing to have bloody drainage then she will need to be scheduled for a washout procedure and VAC.  She does not feel like she has a hematoma in her hip but BMI is 39 she lost weight to get surgery and she might have some hematoma that is difficult to palpate with her body habitus.  Encouraged her to increase protein intake.  She is off pain medication.  Recheck 1 week for wound check. ? ?Chief Complaint:  ?Chief Complaint  ?Patient presents with  ? Left Hip - Routine Post Op, Open Wound  ?  Wound swells, opens and drains bloody/serous fluid intermittently.  ? ?Visit Diagnoses:  ?1. Hematoma of left hip, initial encounter   ?2. S/P total left hip arthroplasty   ? ? ?Plan: To increase her activity keep her dressing on.  Recheck 1 week.  Betadine swabs that she can apply and I will send in Keflex 500 mg p.o. twice daily.  Patient is not allergic to Keflex but is allergic to penicillin. ? ?Follow-Up Instructions: Return in about 1 week (around 05/02/2022).  ? ?Orders:  ?No orders of the defined types were placed in this encounter. ? ?Meds ordered this encounter  ?Medications  ? cephALEXin (KEFLEX) 500 MG capsule  ?  Sig: Take 1 capsule (500 mg total) by mouth 2 (two) times daily.  ?  Dispense:  14 capsule  ?  Refill:  1  ?  Not allergic to keflex  ? ? ?Imaging: ?No results found. ? ?PMFS History: ?Patient Active Problem List  ?  Diagnosis Date Noted  ? Hip hematoma, left 04/25/2022  ? S/P total left hip arthroplasty 04/25/2022  ? Acute respiratory failure with hypoxia ([place]) 04/04/2022  ? Essential hypertension 04/04/2022  ? Goals of care, counseling/discussion 04/04/2022  ? Bandemia 04/04/2022  ? Elevated d-dimer 04/04/2022  ? Class II obesity 04/04/2022  ? Acute postoperative anemia due to expected blood loss 04/03/2022  ? ?Past Medical History:  ?Diagnosis Date  ? Anemia   ? "i used to have this all the time"  ? Arthritis   ? Cancer [facility])   ? skin cancer- pt has had 2 spots removed from face/ear area (2020 and 2022)  ? Dyspnea   ? Hypertension   ? Peripheral vascular disease ([place])   ? "blockages in my legs"- "valves not good"- causes leg swelling per patient  ?  ?History reviewed. No pertinent family history.  ?Past Surgical History:  ?Procedure Laterality Date  ? ABDOMINAL HYSTERECTOMY    ? APPENDECTOMY    ? appendix removed with tubal ligation  ? bladder prolapse    ? BREAST SURGERY    ? "couple of breast biopsies" on left side per patient  ? CARDIAC CATHETERIZATION    ? between  [id number]. Pt states she had "some blockage in one artery but there was no concern"  ? CATARACT EXTRACTION Bilateral   ? "years ago"  ? CHOLECYSTECTOMY    ? EYE SURGERY Bilateral 1985  ? cataract extraction  ? JOINT REPLACEMENT    ? both knees replaced in 1991  ? KNEE SURGERY Left 1996  ? revision  ? TONSILLECTOMY    ? removed as a child  ? TOTAL HIP ARTHROPLASTY Left 04/01/2022  ? Procedure: LEFT TOTAL HIP ARTHROPLASTY ANTERIOR APPROACH;  Surgeon: [name], MD;  Location: [place];  Service: Orthopedics;  Laterality: Left;  ? TUBAL LIGATION  1970  ? ?Social History  ? ?Occupational History  ? Not on file  ?Tobacco Use  ? Smoking status: Former  ?  Types: Cigarettes  ?  Quit date: 1989  ?  Years since quitting: 34.3  ? Smokeless tobacco: Never  ?Vaping Use  ? Vaping Use: Never used  ?Substance and Sexual Activity  ? Alcohol use: Yes  ?  Comment:  occasionally-[place] when in [place]  ? Drug use: Never  ? Sexual activity: Not on file  ? ? ? ?

## 2022-05-02 ENCOUNTER — Encounter: Payer: Self-pay | Admitting: Orthopaedic Surgery

## 2022-05-02 ENCOUNTER — Ambulatory Visit (INDEPENDENT_AMBULATORY_CARE_PROVIDER_SITE_OTHER): Payer: Federal, State, Local not specified - PPO | Admitting: Orthopaedic Surgery

## 2022-05-02 ENCOUNTER — Telehealth: Payer: Self-pay | Admitting: Radiology

## 2022-05-02 VITALS — Ht 65.0 in | Wt 229.0 lb

## 2022-05-02 DIAGNOSIS — S7002XA Contusion of left hip, initial encounter: Secondary | ICD-10-CM

## 2022-05-02 MED ORDER — HYDROCODONE-ACETAMINOPHEN 5-325 MG PO TABS: 1.0000 | ORAL_TABLET | Freq: Two times a day (BID) | ORAL | 0 refills | Status: DC | PRN

## 2022-05-02 MED ORDER — CEPHALEXIN 500 MG PO CAPS: 500.0000 mg | ORAL_CAPSULE | Freq: Two times a day (BID) | ORAL | 0 refills | Status: DC

## 2022-05-02 NOTE — Progress Notes (Signed)
? ?Post-Op Visit Note ?  ?Patient: Meghan Miller           ?Date of Birth: [birth year]           ?MRN: [id number] ?Visit Date: 05/02/2022 ?PCP: Neale Burly, MD ? ? ?Assessment & Plan: Postop total hip arthroplasty.  Not had any drainage in over a week.  She does have some subcutaneous puffiness consistent with hematoma.  No cellulitis no fever.  She is using 1 hydrocodone in the morning and refill sent and 40 tablets.  I will check her in 3 weeks.  If she develops any drainage she will call us promptly. ? ?Chief Complaint:  ?Chief Complaint  ?Patient presents with  ? Left Hip - Wound Check  ?  04/01/2022 Left THA  ? ?Visit Diagnoses:  ?1. Hematoma of left hip, initial encounter   ? ? ?Plan: Return 3 weeks for wound check. ? ?Follow-Up Instructions: No follow-ups on file.  ? ?Orders:  ?No orders of the defined types were placed in this encounter. ? ?No orders of the defined types were placed in this encounter. ? ? ?Imaging: ?No results found. ? ?PMFS History: ?Patient Active Problem List  ? Diagnosis Date Noted  ? Hip hematoma, left 04/25/2022  ? S/P total left hip arthroplasty 04/25/2022  ? Acute respiratory failure with hypoxia ([place]) 04/04/2022  ? Essential hypertension 04/04/2022  ? Goals of care, counseling/discussion 04/04/2022  ? Bandemia 04/04/2022  ? Elevated d-dimer 04/04/2022  ? Class II obesity 04/04/2022  ? Acute postoperative anemia due to expected blood loss 04/03/2022  ? ?Past Medical History:  ?Diagnosis Date  ? Anemia   ? "i used to have this all the time"  ? Arthritis   ? Cancer [facility])   ? skin cancer- pt has had 2 spots removed from face/ear area (2020 and 2022)  ? Dyspnea   ? Hypertension   ? Peripheral vascular disease ([place])   ? "blockages in my legs"- "valves not good"- causes leg swelling per patient  ?  ?No family history on file.  ?Past Surgical History:  ?Procedure Laterality Date  ? ABDOMINAL HYSTERECTOMY    ? APPENDECTOMY    ? appendix removed with tubal ligation  ? bladder prolapse    ?  BREAST SURGERY    ? "couple of breast biopsies" on left side per patient  ? CARDIAC CATHETERIZATION    ? between 1985-1995. Pt states she had "some blockage in one artery but there was no concern"  ? CATARACT EXTRACTION Bilateral   ? "years ago"  ? CHOLECYSTECTOMY    ? EYE SURGERY Bilateral 1985  ? cataract extraction  ? JOINT REPLACEMENT    ? both knees replaced in 1991  ? KNEE SURGERY Left 1996  ? revision  ? TONSILLECTOMY    ? removed as a child  ? TOTAL HIP ARTHROPLASTY Left 04/01/2022  ? Procedure: LEFT TOTAL HIP ARTHROPLASTY ANTERIOR APPROACH;  Surgeon: [name], MD;  Location: [facility];  Service: Orthopedics;  Laterality: Left;  ? TUBAL LIGATION  1970  ? ?Social History  ? ?Occupational History  ? Not on file  ?Tobacco Use  ? Smoking status: Former  ?  Types: Cigarettes  ?  Quit date: 1989  ?  Years since quitting: 34.3  ? Smokeless tobacco: Never  ?Vaping Use  ? Vaping Use: Never used  ?Substance and Sexual Activity  ? Alcohol use: Yes  ?  Comment: occasionally-margaritas when in [place]  ? Drug use: Never  ?  Sexual activity: Not on file  ? ? ? ?

## 2022-05-02 NOTE — Telephone Encounter (Signed)
Patient called requesting refill on Keflex. Pharmacy told her that she did not have refill on prescription. Would you like to refill? ?

## 2022-05-02 NOTE — Addendum Note (Signed)
Addended by: Meyer Cory on: 05/02/2022 05:00 PM ? ? Modules accepted: Orders ? ?

## 2022-05-02 NOTE — Telephone Encounter (Signed)
Rx sent to pharmacy. I called patient and advised. ?

## 2022-05-03 ENCOUNTER — Other Ambulatory Visit: Payer: Self-pay | Admitting: Orthopaedic Surgery

## 2022-05-03 ENCOUNTER — Telehealth: Payer: Self-pay | Admitting: Orthopaedic Surgery

## 2022-05-03 MED ORDER — HYDROCODONE-ACETAMINOPHEN 5-325 MG PO TABS: 1.0000 | ORAL_TABLET | Freq: Two times a day (BID) | ORAL | 0 refills | Status: DC | PRN

## 2022-05-03 MED ORDER — HYDROCODONE-ACETAMINOPHEN 5-325 MG PO TABS: 1.0000 | ORAL_TABLET | Freq: Two times a day (BID) | ORAL | 0 refills | Status: AC | PRN

## 2022-05-03 NOTE — Telephone Encounter (Signed)
Received call from [place] with [facility] called advised she did not receive a Rx for Hydrocodone. The number to contact Meghan Miller is [phone] ?

## 2022-05-03 NOTE — Telephone Encounter (Signed)
Could you please resend? It does not look like rx went electronically and it was not received by pharmacy. ?

## 2022-05-03 NOTE — Telephone Encounter (Signed)
I spoke with pharmacy. They received rx but states prior authorization is needed.  I called [phone] and spoke with Zara. Authorization sent to pharmacy.  I called pharmacy and advised. ?

## 2022-05-06 ENCOUNTER — Telehealth: Payer: Self-pay

## 2022-05-06 NOTE — Telephone Encounter (Signed)
Please advise 

## 2022-05-06 NOTE — Telephone Encounter (Signed)
Patient had surgery on 04/01/2022 Left THA. Drainage started again today. Would like to know if she needs to come in. Taking Cephalexin. States she was just seen at the [place] office on 05/02/2022. ? ? ? ? ?CB [phone] ?

## 2022-05-07 ENCOUNTER — Encounter: Payer: Self-pay | Admitting: Orthopaedic Surgery

## 2022-05-07 ENCOUNTER — Ambulatory Visit: Payer: Federal, State, Local not specified - PPO | Admitting: Orthopaedic Surgery

## 2022-05-07 VITALS — BP 137/75 | HR 87 | Temp 97.7°F | Ht 65.0 in | Wt 229.0 lb

## 2022-05-07 DIAGNOSIS — Z96642 Presence of left artificial hip joint: Secondary | ICD-10-CM

## 2022-05-07 MED ORDER — CEPHALEXIN 500 MG PO CAPS: 500.0000 mg | ORAL_CAPSULE | Freq: Two times a day (BID) | ORAL | 0 refills | Status: AC

## 2022-05-07 NOTE — Telephone Encounter (Signed)
I called patient. Worked in at [employer]. ?

## 2022-05-07 NOTE — Progress Notes (Signed)
? ?Post-Op Visit Note ?  ?Patient: Meghan Miller           ?Date of Birth: 08-Nov-1936           ?MRN: [id number] ?Visit Date: 05/07/2022 ?PCP: Neale Burly, MD ? ? ?Assessment & Plan: Postop total hip arthroplasty 04/01/2022.  She is now almost 6 weeks out.  Wound was sealed off she has problems with fluid retention and after about a week small pinpoint hole opened up and she has had serosanguineous drainage.  Betadine prep.  The needle inferior to this slightly distal and posterior.  Aspirated 20 cc but then was able to drip out through the needle estimated 50  to 80cc.  Recheck 1 week.  If she develops purulent drainage she will let us know she is taking minimal pain medication increasing her protein intake trying to avoid food with salt.  Husband's been squeezing the fluid out when he gets puffy.  No fever or chills.  Again in detail we discussed the potential developed infection if this occurs she will need to have washout procedure and possibly VAC placed.  Has normal continue to apply Betadine with dressing change as needed. ? ?Chief Complaint:  ?Chief Complaint  ?Patient presents with  ? Left Hip - Wound Check  ?  04/01/2022 Left THA  ? ?Visit Diagnoses: No diagnosis found. ? ?Plan: Return 1 week for wound check. ? ?Follow-Up Instructions: Return in about 1 week (around 05/14/2022).  ? ?Orders:  ?No orders of the defined types were placed in this encounter. ? ?Meds ordered this encounter  ?Medications  ? cephALEXin (KEFLEX) 500 MG capsule  ?  Sig: Take 1 capsule (500 mg total) by mouth 2 (two) times daily.  ?  Dispense:  14 capsule  ?  Refill:  0  ?  Not allergic to keflex  ? ? ?Imaging: ?No results found. ? ?PMFS History: ?Patient Active Problem List  ? Diagnosis Date Noted  ? Hip hematoma, left 04/25/2022  ? S/P total left hip arthroplasty 04/25/2022  ? Acute respiratory failure with hypoxia ([place]) 04/04/2022  ? Essential hypertension 04/04/2022  ? Goals of care, counseling/discussion 04/04/2022  ? Bandemia  04/04/2022  ? Elevated d-dimer 04/04/2022  ? Class II obesity 04/04/2022  ? Acute postoperative anemia due to expected blood loss 04/03/2022  ? ?Past Medical History:  ?Diagnosis Date  ? Anemia   ? "i used to have this all the time"  ? Arthritis   ? Cancer [facility])   ? skin cancer- pt has had 2 spots removed from face/ear area (2020 and 2022)  ? Dyspnea   ? Hypertension   ? Peripheral vascular disease ([place])   ? "blockages in my legs"- "valves not good"- causes leg swelling per patient  ?  ?No family history on file.  ?Past Surgical History:  ?Procedure Laterality Date  ? ABDOMINAL HYSTERECTOMY    ? APPENDECTOMY    ? appendix removed with tubal ligation  ? bladder prolapse    ? BREAST SURGERY    ? "couple of breast biopsies" on left side per patient  ? CARDIAC CATHETERIZATION    ? between 1985-1995. Pt states she had "some blockage in one artery but there was no concern"  ? CATARACT EXTRACTION Bilateral   ? "years ago"  ? CHOLECYSTECTOMY    ? EYE SURGERY Bilateral 1985  ? cataract extraction  ? JOINT REPLACEMENT    ? both knees replaced in 1991  ? KNEE SURGERY Left 1996  ?  revision  ? TONSILLECTOMY    ? removed as a child  ? TOTAL HIP ARTHROPLASTY Left 04/01/2022  ? Procedure: LEFT TOTAL HIP ARTHROPLASTY ANTERIOR APPROACH;  Surgeon: [name], MD;  Location: [facility];  Service: Orthopedics;  Laterality: Left;  ? TUBAL LIGATION  1970  ? ?Social History  ? ?Occupational History  ? Not on file  ?Tobacco Use  ? Smoking status: Former  ?  Types: Cigarettes  ?  Quit date: 1989  ?  Years since quitting: 34.3  ? Smokeless tobacco: Never  ?Vaping Use  ? Vaping Use: Never used  ?Substance and Sexual Activity  ? Alcohol use: Yes  ?  Comment: occasionally-margaritas when in [place]  ? Drug use: Never  ? Sexual activity: Not on file  ? ? ? ?

## 2022-05-14 ENCOUNTER — Ambulatory Visit (INDEPENDENT_AMBULATORY_CARE_PROVIDER_SITE_OTHER): Payer: Federal, State, Local not specified - PPO | Admitting: Orthopaedic Surgery

## 2022-05-14 ENCOUNTER — Encounter: Payer: Self-pay | Admitting: Orthopaedic Surgery

## 2022-05-14 VITALS — BP 113/67 | HR 87 | Ht 65.0 in | Wt 229.0 lb

## 2022-05-14 DIAGNOSIS — S7002XA Contusion of left hip, initial encounter: Secondary | ICD-10-CM

## 2022-05-14 DIAGNOSIS — Z96642 Presence of left artificial hip joint: Secondary | ICD-10-CM

## 2022-05-14 NOTE — Progress Notes (Signed)
? ?Post-Op Visit Note ?  ?Patient: Meghan Miller           ?Date of Birth: [birth year]           ?MRN: [id number] ?Visit Date: 05/14/2022 ?PCP: Neale Burly, MD ? ? ?Assessment & Plan: 86 year old female follow-up polyp arthroplasty left.  She lost considerable weight to make the BMI cut off and has had intermittent serous drainage from her incision.  Last several days the drainage has pretty much stopped.  She has 2 days left of antibiotics and should be able to finish them up and stop.  She develops recurrent drainage we discussed that if there was any purulence then she would require reoperation and a washout procedure. ? ?Chief Complaint:  ?Chief Complaint  ?Patient presents with  ? Left Hip - Wound Check  ?  04/01/2022 Left THA  ? ?Visit Diagnoses:  ?1. S/P total left hip arthroplasty   ?2. Hematoma of left hip, initial encounter   ? ? ?Plan: Recheck 3 weeks in the evening clinic. ? ?Follow-Up Instructions: Return in about 3 weeks (around 06/04/2022).  ? ?Orders:  ?No orders of the defined types were placed in this encounter. ? ?No orders of the defined types were placed in this encounter. ? ? ?Imaging: ?No results found. ? ?PMFS History: ?Patient Active Problem List  ? Diagnosis Date Noted  ? Hip hematoma, left 04/25/2022  ? S/P total left hip arthroplasty 04/25/2022  ? Acute respiratory failure with hypoxia ([place]) 04/04/2022  ? Essential hypertension 04/04/2022  ? Goals of care, counseling/discussion 04/04/2022  ? Bandemia 04/04/2022  ? Elevated d-dimer 04/04/2022  ? Class II obesity 04/04/2022  ? Acute postoperative anemia due to expected blood loss 04/03/2022  ? ?Past Medical History:  ?Diagnosis Date  ? Anemia   ? "i used to have this all the time"  ? Arthritis   ? Cancer [facility])   ? skin cancer- pt has had 2 spots removed from face/ear area (2020 and 2022)  ? Dyspnea   ? Hypertension   ? Peripheral vascular disease ([place])   ? "blockages in my legs"- "valves not good"- causes leg swelling per patient  ?  ?No  family history on file.  ?Past Surgical History:  ?Procedure Laterality Date  ? ABDOMINAL HYSTERECTOMY    ? APPENDECTOMY    ? appendix removed with tubal ligation  ? bladder prolapse    ? BREAST SURGERY    ? "couple of breast biopsies" on left side per patient  ? CARDIAC CATHETERIZATION    ? between 1985-1995. Pt states she had "some blockage in one artery but there was no concern"  ? CATARACT EXTRACTION Bilateral   ? "years ago"  ? CHOLECYSTECTOMY    ? EYE SURGERY Bilateral 1985  ? cataract extraction  ? JOINT REPLACEMENT    ? both knees replaced in 1991  ? KNEE SURGERY Left 1996  ? revision  ? TONSILLECTOMY    ? removed as a child  ? TOTAL HIP ARTHROPLASTY Left 04/01/2022  ? Procedure: LEFT TOTAL HIP ARTHROPLASTY ANTERIOR APPROACH;  Surgeon: [name], MD;  Location: [facility];  Service: Orthopedics;  Laterality: Left;  ? TUBAL LIGATION  1970  ? ?Social History  ? ?Occupational History  ? Not on file  ?Tobacco Use  ? Smoking status: Former  ?  Types: Cigarettes  ?  Quit date: 1989  ?  Years since quitting: 34.3  ? Smokeless tobacco: Never  ?Vaping Use  ? Vaping Use:  Never used  ?Substance and Sexual Activity  ? Alcohol use: Yes  ?  Comment: occasionally-m[place]aritas when in [place]  ? Drug use: Never  ? Sexual activity: Not on file  ? ? ? ?

## 2022-05-16 ENCOUNTER — Encounter: Payer: Federal, State, Local not specified - PPO | Admitting: Orthopaedic Surgery

## 2022-05-23 ENCOUNTER — Ambulatory Visit: Payer: Federal, State, Local not specified - PPO | Admitting: Orthopaedic Surgery

## 2022-05-30 ENCOUNTER — Ambulatory Visit (INDEPENDENT_AMBULATORY_CARE_PROVIDER_SITE_OTHER): Payer: Federal, State, Local not specified - PPO | Admitting: Orthopaedic Surgery

## 2022-05-30 DIAGNOSIS — Z96642 Presence of left artificial hip joint: Secondary | ICD-10-CM

## 2022-05-30 NOTE — Progress Notes (Signed)
   Post-Op Visit Note   Patient: Meghan Miller           Date of Birth: 1936/11/05           MRN: [id number] Visit Date: 05/30/2022 PCP: Neale Burly, MD   Assessment & Plan: Hip incision is healed no drainage.  She still has pitting edema I encouraged her to put her TED hose on first thing in the morning after keeping her legs elevated.  She can gradually progress to walking with a cane she is very happy with the results of surgery.  Chief Complaint: No chief complaint on file.  Visit Diagnoses:  1. S/P total left hip arthroplasty     Plan: Return as needed  Follow-Up Instructions: Return if symptoms worsen or fail to improve.   Orders:  No orders of the defined types were placed in this encounter.  No orders of the defined types were placed in this encounter.   Imaging: No results found.  PMFS History: Patient Active Problem List   Diagnosis Date Noted   Hip hematoma, left 04/25/2022   S/P total left hip arthroplasty 04/25/2022   Acute respiratory failure with hypoxia ([place]) 04/04/2022   Essential hypertension 04/04/2022   Goals of care, counseling/discussion 04/04/2022   Bandemia 04/04/2022   Elevated d-dimer 04/04/2022   Class II obesity 04/04/2022   Acute postoperative anemia due to expected blood loss 04/03/2022   Past Medical History:  Diagnosis Date   Anemia    "i used to have this all the time"   Arthritis    Cancer ([place])    skin cancer- pt has had 2 spots removed from face/ear area (2020 and 2022)   Dyspnea    Hypertension    Peripheral vascular disease ([place])    "blockages in my legs"- "valves not good"- causes leg swelling per patient    No family history on file.  Past Surgical History:  Procedure Laterality Date   ABDOMINAL HYSTERECTOMY     APPENDECTOMY     appendix removed with tubal ligation   bladder prolapse     BREAST SURGERY     "couple of breast biopsies" on left side per patient   CARDIAC CATHETERIZATION     between 1985-1995.  Pt states she had "some blockage in one artery but there was no concern"   CATARACT EXTRACTION Bilateral    "years ago"   CHOLECYSTECTOMY     EYE SURGERY Bilateral 1985   cataract extraction   JOINT REPLACEMENT     both knees replaced in [place]     removed as a child   TOTAL HIP ARTHROPLASTY Left 04/01/2022   Procedure: LEFT TOTAL HIP ARTHROPLASTY ANTERIOR APPROACH;  Surgeon: [name], MD;  Location: [facility];  Service: Orthopedics;  Laterality: Left;   TUBAL LIGATION  1970   Social History   Occupational History   Not on file  Tobacco Use   Smoking status: Former    Types: Cigarettes    Quit date: 1989    Years since quitting: 34.4   Smokeless tobacco: Never  Vaping Use   Vaping Use: Never used  Substance and Sexual Activity   Alcohol use: Yes    Comment: occasionally-margaritas when in [place]   Drug use: Never   Sexual activity: Not on file

## 2022-11-30 IMAGING — DX DG HIP (WITH OR WITHOUT PELVIS) 1V PORT*L*
2 series · 2 of 2 positions shown · non-contrast
Comparison: Radiograph dated November 12, 2021

CLINICAL DATA: Postop for left hip arthroplasty

EXAM:
DG HIP (WITH OR WITHOUT PELVIS) 1V PORT LEFT

[pelvis ap]
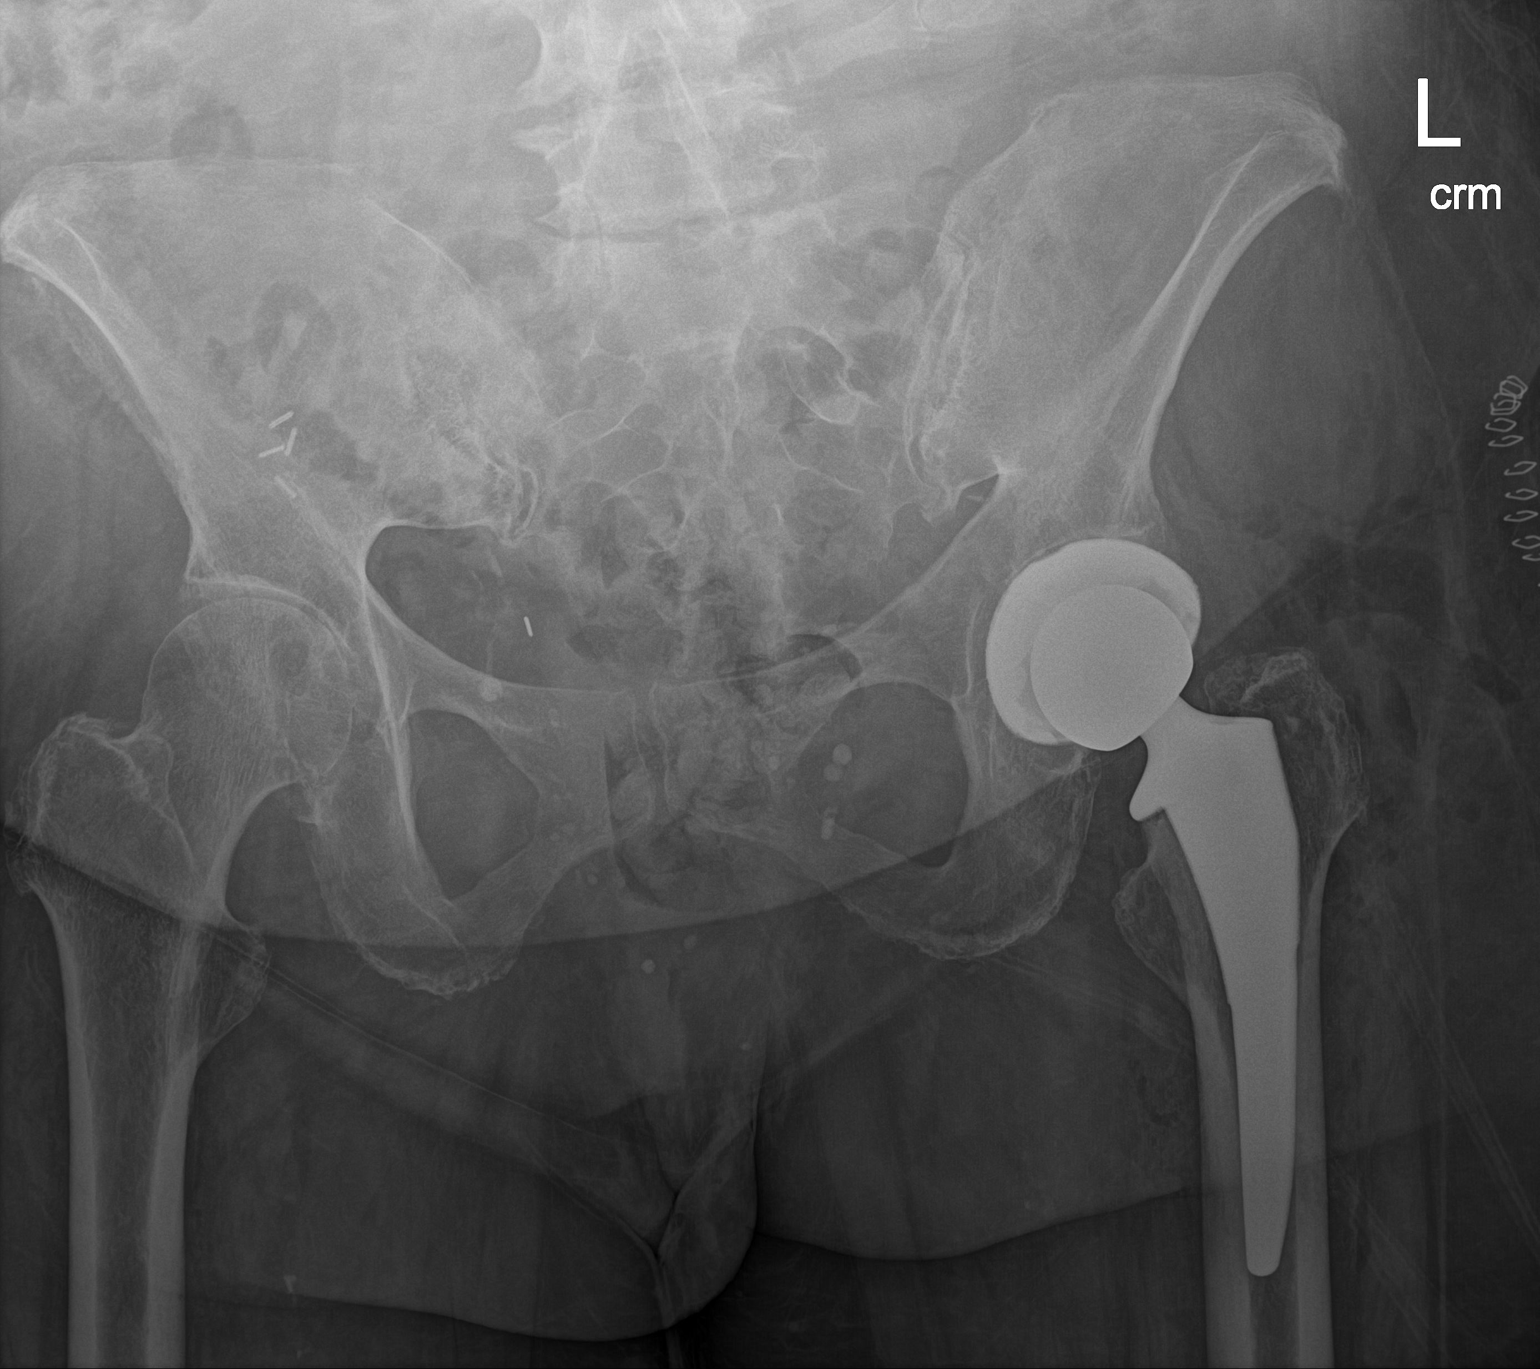

[hip lat]
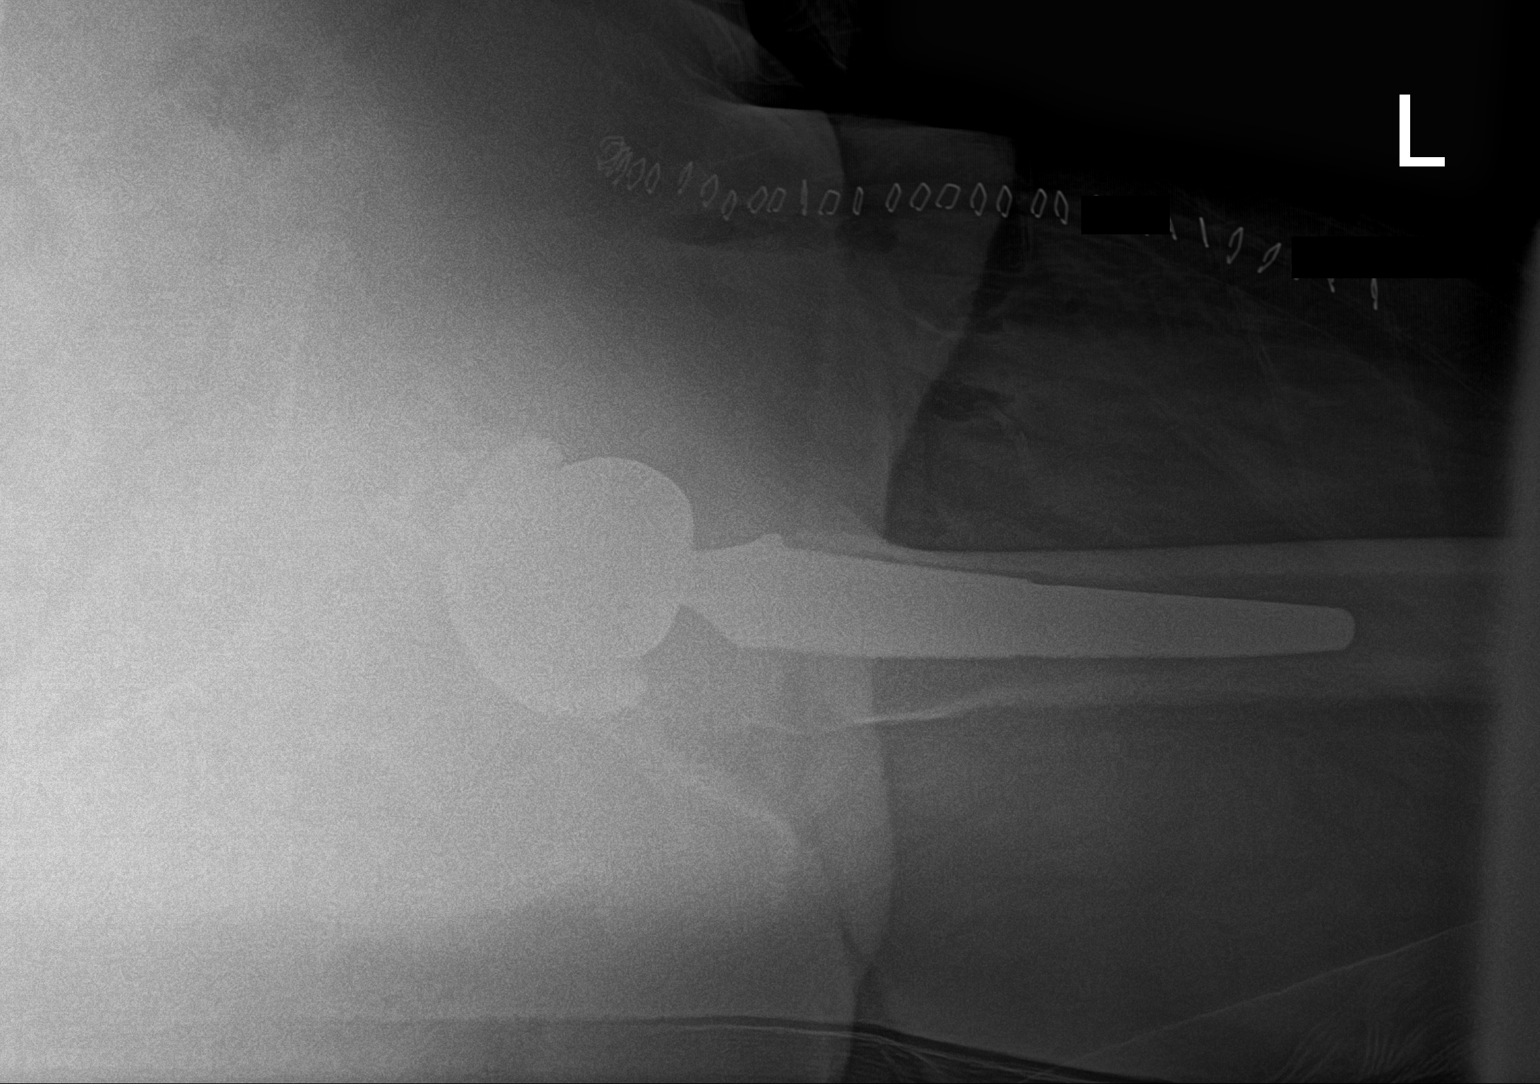

[2 of 2 positions shown; findings below may reference images not displayed]

FINDINGS: Status post left hip arthroplasty. No acute periprosthetic fracture.
Subcutaneous emphysema and surgical clips as expected.
IMPRESSION: Status post left hip arthroplasty without acute complications.

## 2022-11-30 IMAGING — DX DG CHEST 2V
2 series · 2 of 2 positions shown · non-contrast
Comparison: None

CLINICAL DATA: A female at age 85 presents for evaluation of the
chest in preparation for hip replacement.

EXAM:
CHEST - 2 VIEW

[w chest pa]
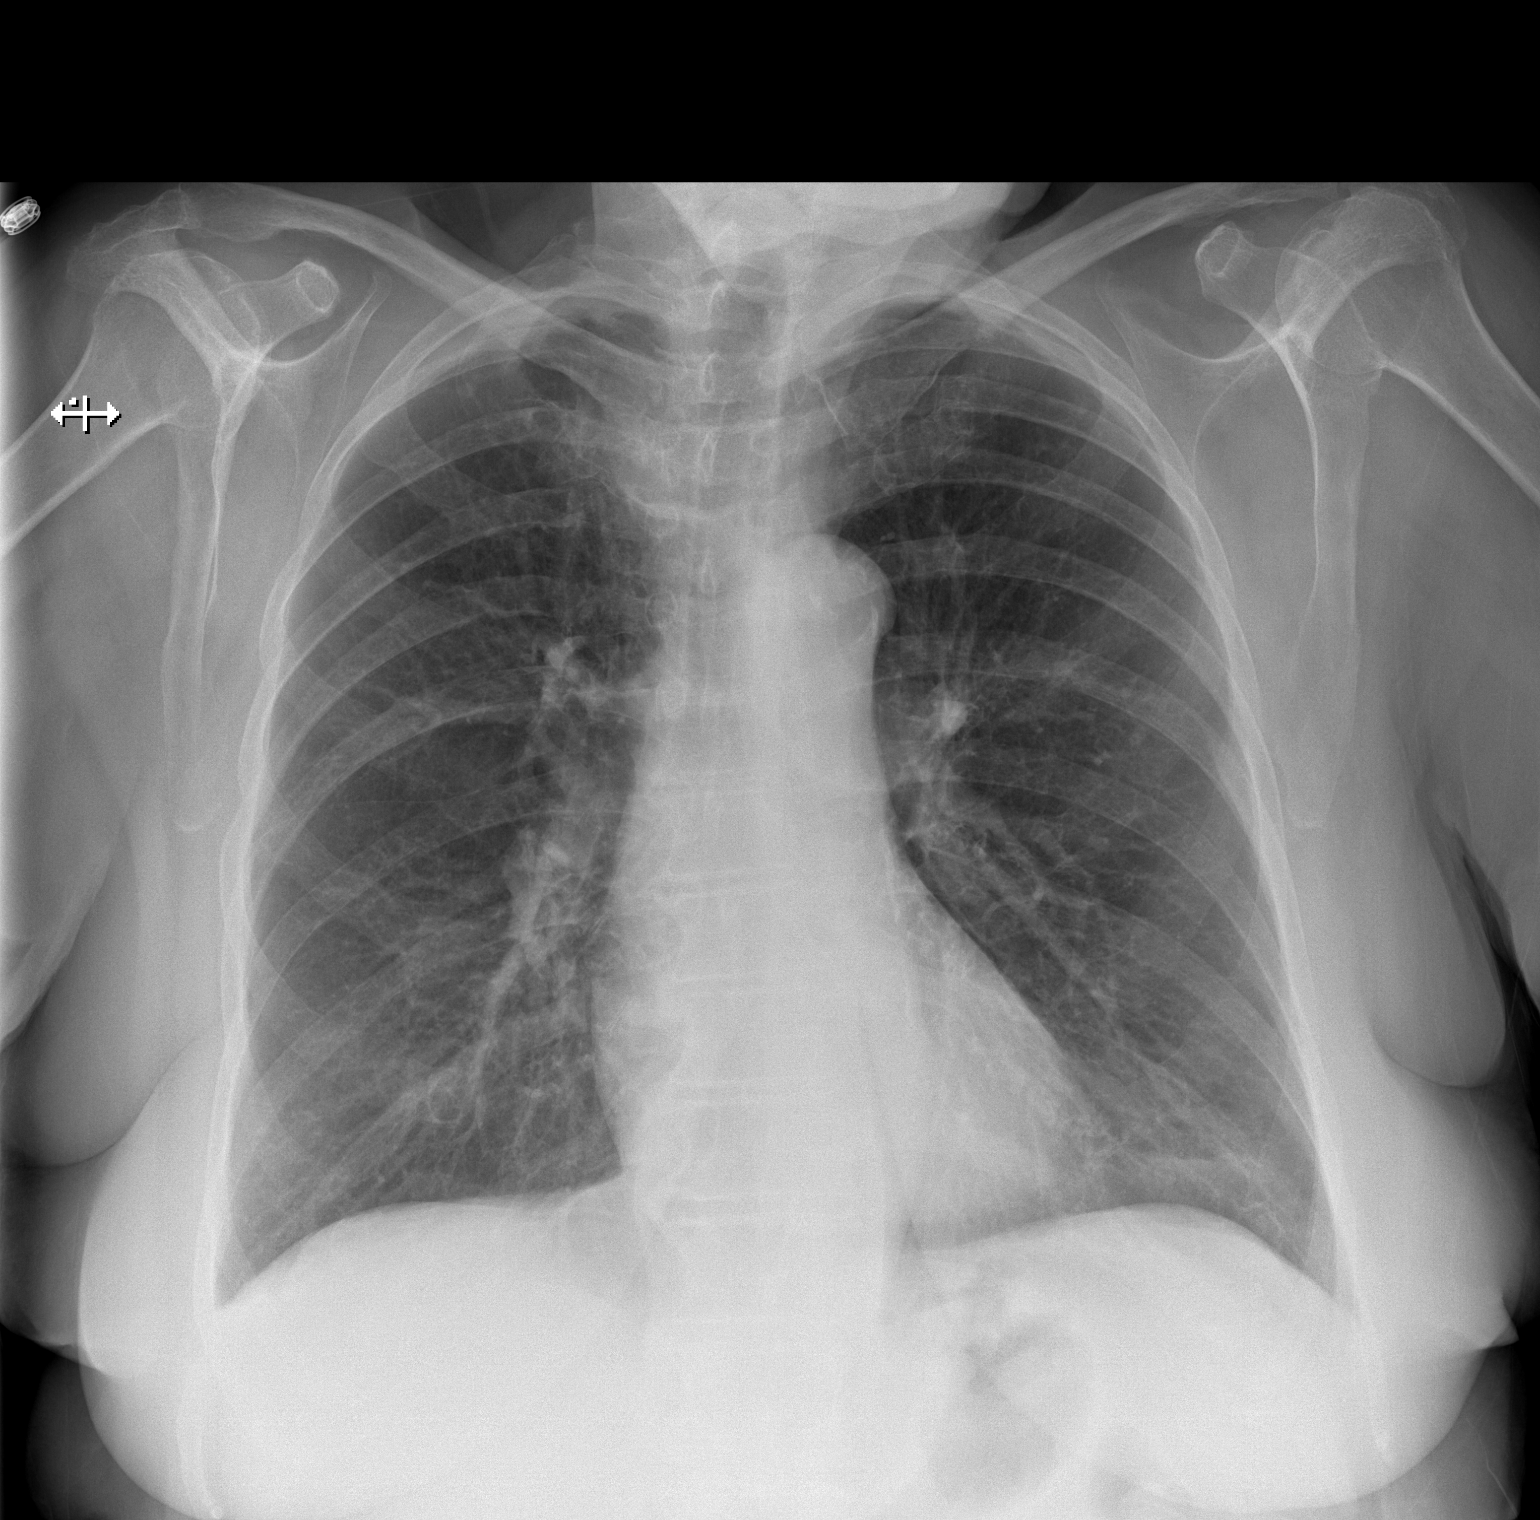

[w chest lat]
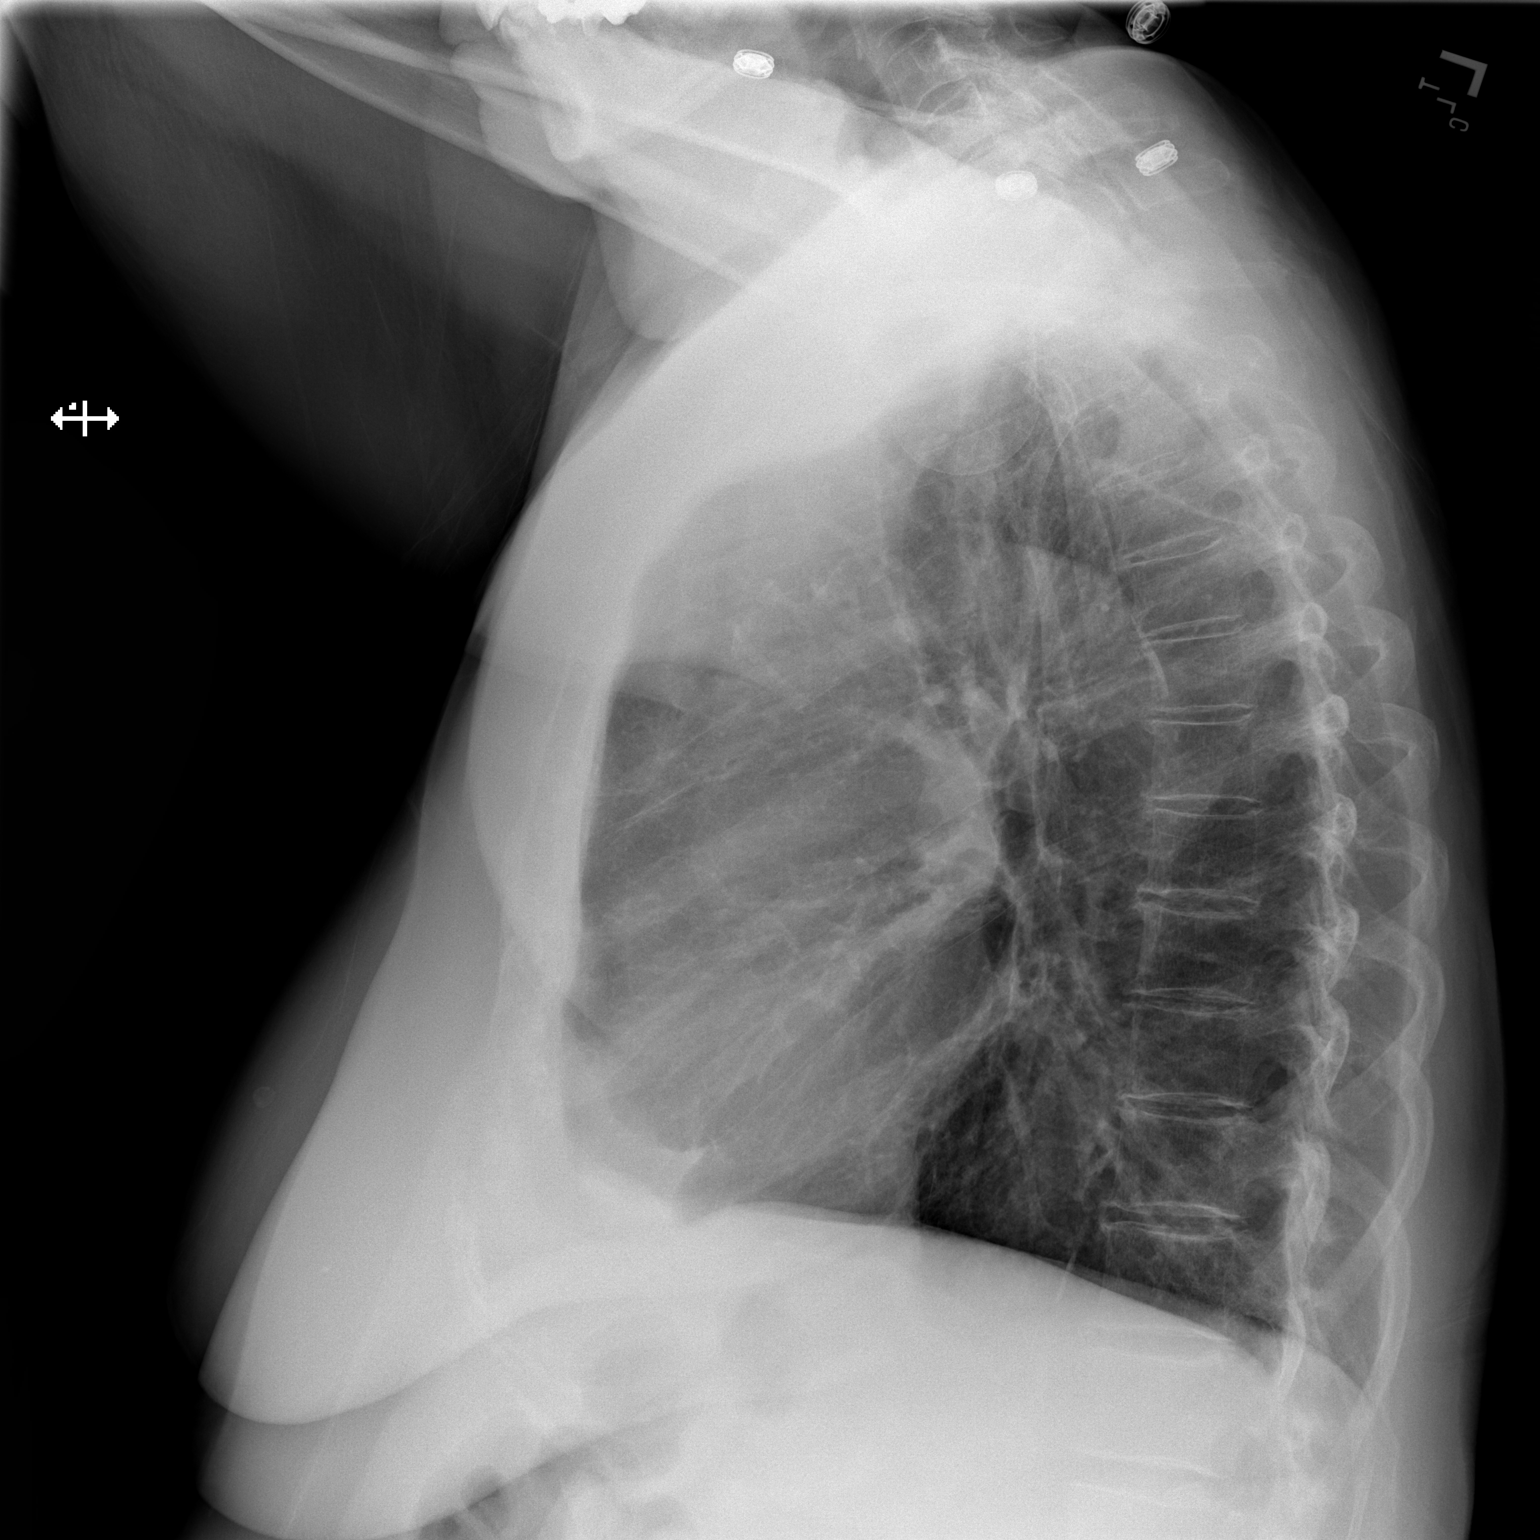

[2 of 2 positions shown; findings below may reference images not displayed]

FINDINGS: The heart size and mediastinal contours are within normal limits.
Both lungs are clear. The visualized skeletal structures are
unremarkable.
IMPRESSION: No active cardiopulmonary disease.

## 2022-12-03 IMAGING — CR DG CHEST 2V
2 series · 3 of 3 positions shown · non-contrast
Comparison: 04/01/2022

CLINICAL DATA: Hypoxia

EXAM:
CHEST - 2 VIEW

[Series 2: chest lat · 0.14mm/px · 2 of 2 slices shown]
[im 1/2]
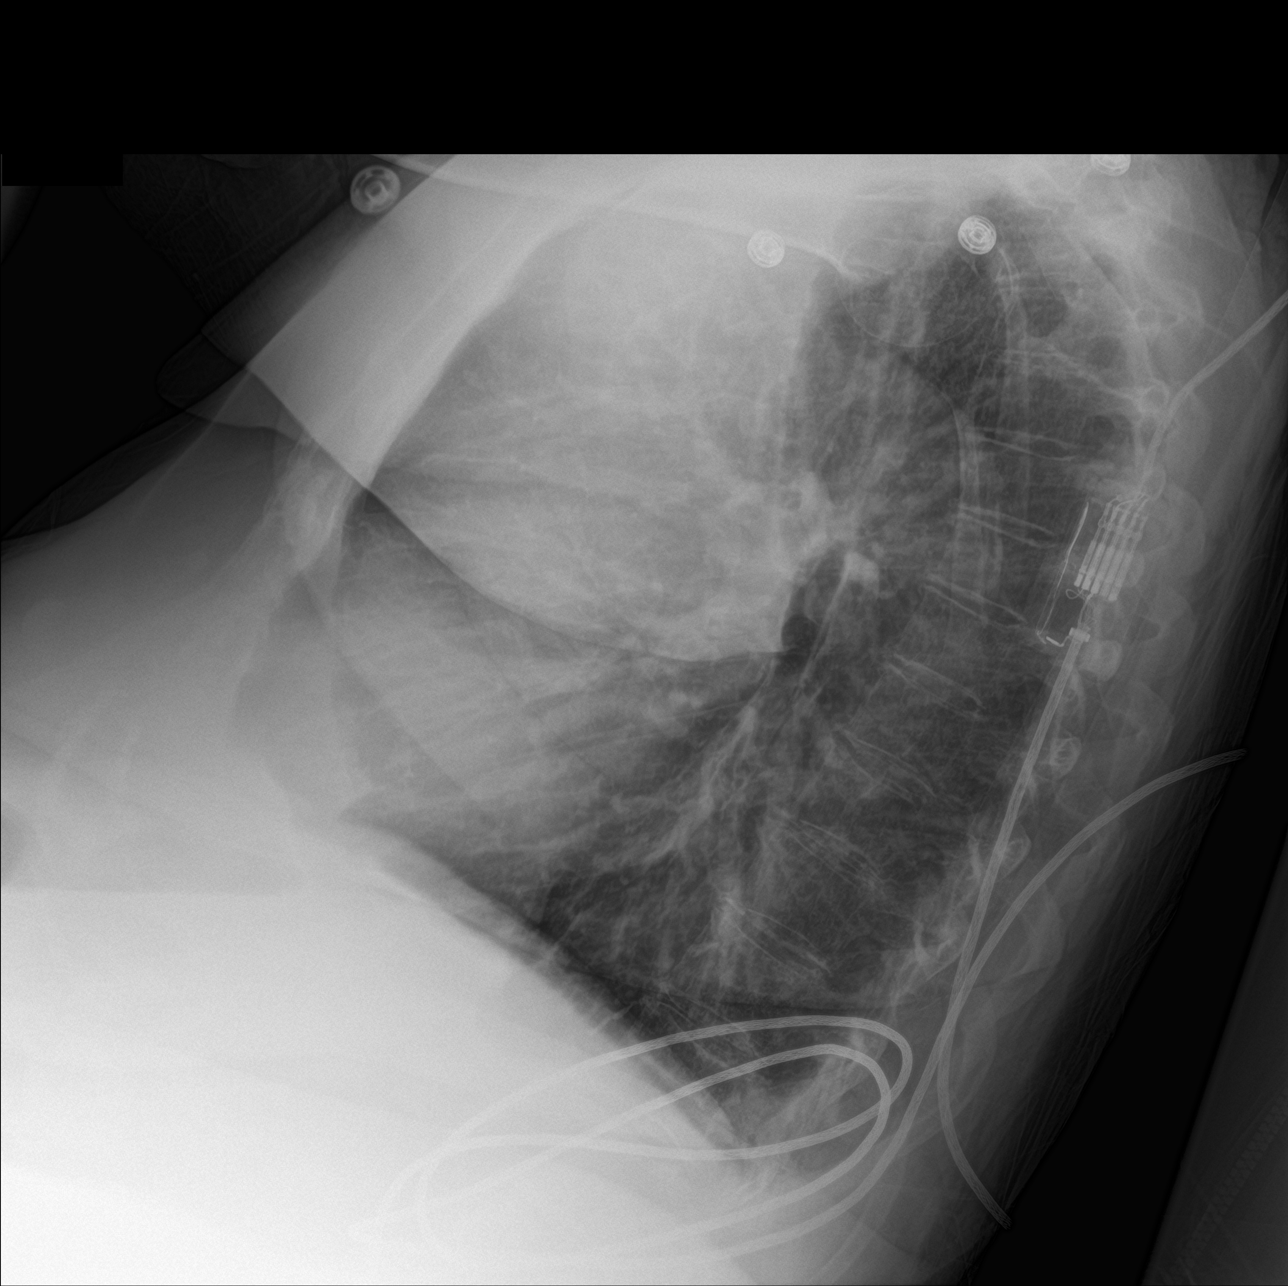
[im 2/2]
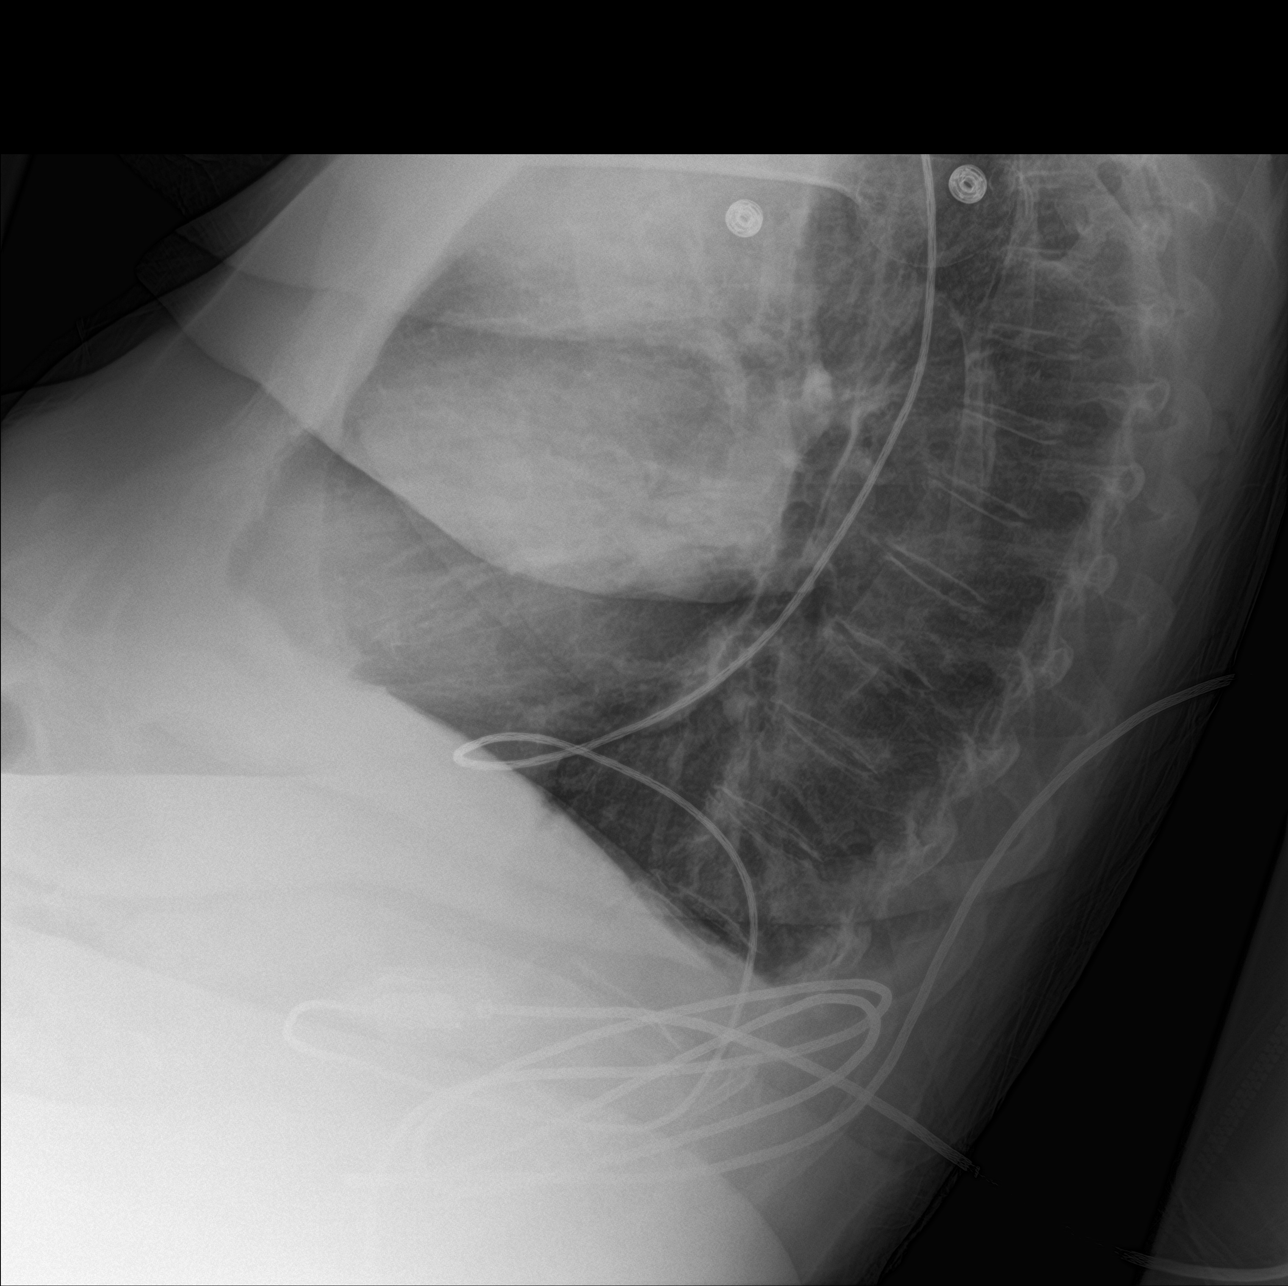

[chest ap]
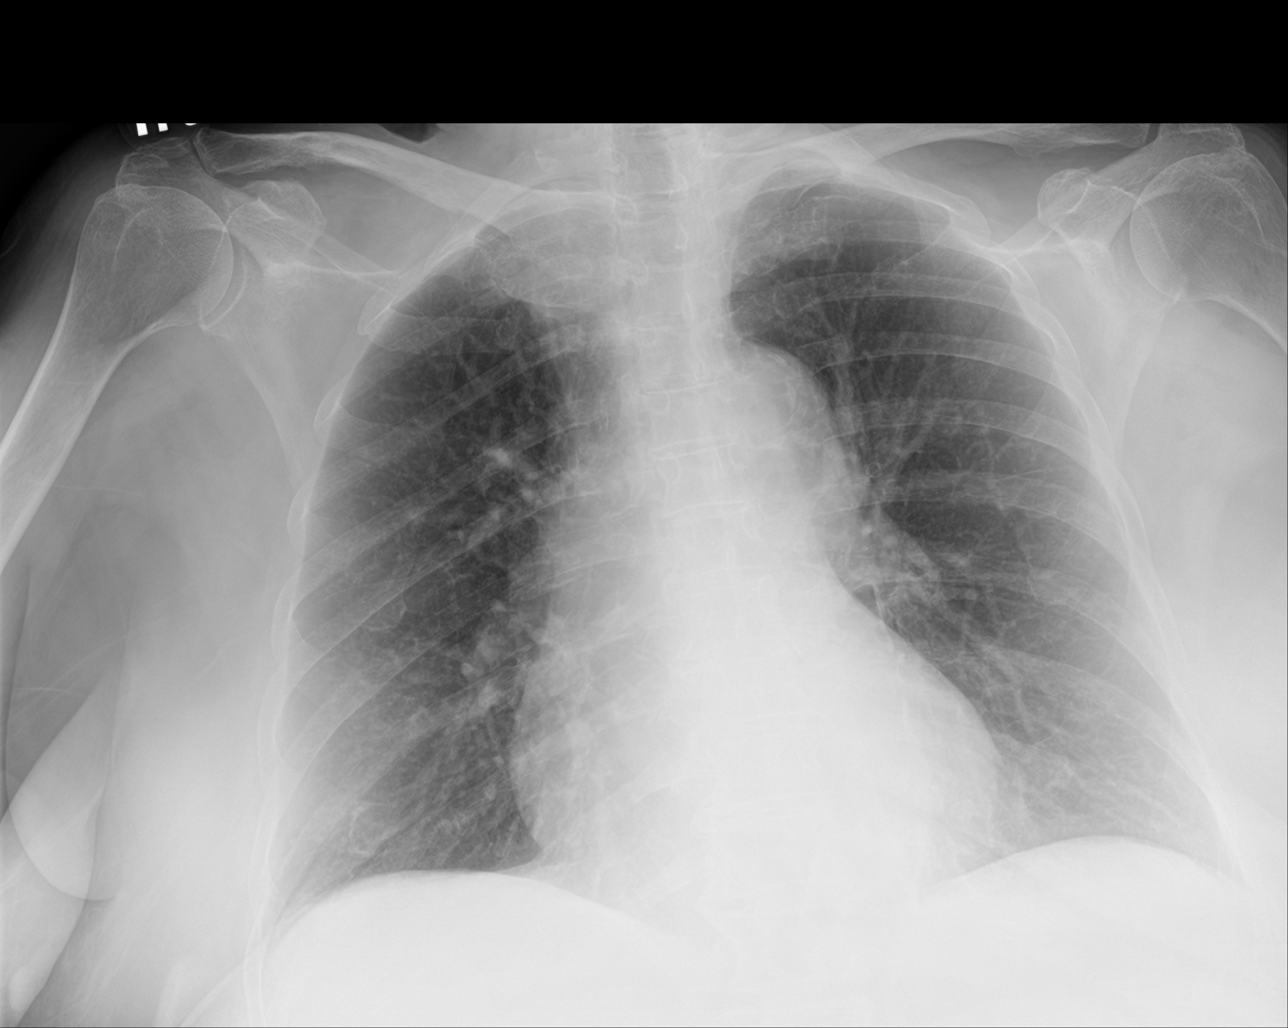

[3 of 3 positions shown; findings below may reference images not displayed]

FINDINGS: Transverse diameter of heart is slightly increased. Thoracic aorta
is tortuous. There are no signs of pulmonary edema or focal
pulmonary consolidation. There is no pleural effusion or
pneumothorax.
IMPRESSION: No active cardiopulmonary disease.

## 2023-04-04 NOTE — Progress Notes (Unsigned)
Office Visit Note  Patient: Meghan Miller             Date of Birth: 1936-06-09           MRN: [id number]             PCP: Toma Deiters, MD Referring: Toma Deiters, MD Visit Date: 04/17/2023 Occupation: @GUAROCC @  Subjective:  Pain in multiple joints   History of Present Illness: Meghan Miller is a 87 y.o. female who presents today as requested by her PCP.  She has had chronic pain involving multiple joints for many years. Patient reports that she was evaluated by a rheumatologist when she was living in [place] many years but was never diagnosed with a rheumatologic condition.  She did not follow up with a rheumatologist while living in [place].  She moved to [place] 2 years ago, and she has been under the care of her PCP Dr. Olena Leatherwood and Dr. Ophelia Charter.  She had a left hip replacement on 04/01/22 performed by Dr. Ophelia Charter.  She continues to have persistent discomfort in both hips consistent with trochanteric bursitis.  She had a left trochanteric bursa cortisone injection performed by Dr. Olena Leatherwood several months ago which provided temporary relief.  She requested a repeat injection today.  At times her pain is severe.  She is using a cane to assist with ambulation.  She takes Aleve twice daily for pain relief.  She states that she has occasional arthralgias in her shoulders, hands, and feet.  Her arthralgias are typically exacerbated by weather changes or physical activity.  Activities of Daily Living:  Patient reports morning stiffness for  all day. Patient Reports nocturnal pain.  Difficulty dressing/grooming: Denies Difficulty climbing stairs: Reports Difficulty getting out of chair: Reports Difficulty using hands for taps, buttons, cutlery, and/or writing: Reports  Review of Systems  Constitutional:  Negative for fatigue.  HENT:  Negative for mouth sores and mouth dryness.   Eyes:  Negative for dryness.  Respiratory:  Negative for shortness of breath.   Cardiovascular:  Negative for  chest pain and palpitations.  Gastrointestinal:  Negative for blood in stool, constipation and diarrhea.  Endocrine: Negative for increased urination.  Genitourinary:  Positive for involuntary urination.  Musculoskeletal:  Positive for joint pain, joint pain, joint swelling, myalgias, morning stiffness and myalgias. Negative for gait problem, muscle weakness and muscle tenderness.  Skin:  Negative for color change, rash, hair loss and sensitivity to sunlight.  Allergic/Immunologic: Negative for susceptible to infections.  Neurological:  Negative for dizziness and headaches.  Hematological:  Negative for swollen glands.  Psychiatric/Behavioral:  Positive for sleep disturbance. Negative for depressed mood. The patient is not nervous/anxious.     PMFS History:  Patient Active Problem List   Diagnosis Date Noted   Hip hematoma, left 04/25/2022   S/P total left hip arthroplasty 04/25/2022   Acute respiratory failure with hypoxia 04/04/2022   Essential hypertension 04/04/2022   Goals of care, counseling/discussion 04/04/2022   Bandemia 04/04/2022   Elevated d-dimer 04/04/2022   Class II obesity 04/04/2022   Acute postoperative anemia due to expected blood loss 04/03/2022    Past Medical History:  Diagnosis Date   Anemia    "i used to have this all the time"   Arthritis    Cancer    skin cancer- pt has had 2 spots removed from face/ear area (2020 and 2022)   Dyspnea    Hypertension    Peripheral vascular disease    "blockages in  my legs"- "valves not good"- causes leg swelling per patient    Family History  Problem Relation Age of Onset   Dementia Mother    Heart attack Son    Alcoholism Son    Drug abuse Son    Past Surgical History:  Procedure Laterality Date   ABDOMINAL HYSTERECTOMY     APPENDECTOMY     appendix removed with tubal ligation   bladder prolapse     BREAST SURGERY     "couple of breast biopsies" on left side per patient   CARDIAC CATHETERIZATION      between 1985-1995. Pt states she had "some blockage in one artery but there was no concern"   CATARACT EXTRACTION Bilateral    "years ago"   CHOLECYSTECTOMY     EYE SURGERY Bilateral 1985   cataract extraction   JOINT REPLACEMENT     both knees replaced in 1991   KNEE SURGERY Left 1996   revision   TONSILLECTOMY     removed as a child   TOTAL HIP ARTHROPLASTY Left 04/01/2022   Procedure: LEFT TOTAL HIP ARTHROPLASTY ANTERIOR APPROACH;  Surgeon: Eldred MangesYates, Mark C, MD;  Location: [facility] OR;  Service: Orthopedics;  Laterality: Left;   TUBAL LIGATION  1970   Social History   Social History Narrative   Not on file    There is no immunization history on file for this patient.   Objective: Vital Signs: BP 113/64 (BP Location: Right Wrist, Patient Position: Sitting, Cuff Size: Normal)   Pulse 66   Resp 16   Ht 5\' 3"  (1.6 m)   Wt 232 lb 9.6 oz (105.5 kg)   BMI 41.20 kg/m    Physical Exam Vitals and nursing note reviewed.  Constitutional:      Appearance: She is well-developed.  HENT:     Head: Normocephalic and atraumatic.  Eyes:     Conjunctiva/sclera: Conjunctivae normal.  Cardiovascular:     Rate and Rhythm: Normal rate and regular rhythm.     Heart sounds: Normal heart sounds.  Pulmonary:     Effort: Pulmonary effort is normal.     Breath sounds: Normal breath sounds.  Abdominal:     General: Bowel sounds are normal.     Palpations: Abdomen is soft.  Musculoskeletal:     Cervical back: Normal range of motion.  Lymphadenopathy:     Cervical: No cervical adenopathy.  Skin:    General: Skin is warm and dry.     Capillary Refill: Capillary refill takes less than 2 seconds.  Neurological:     Mental Status: She is alert and oriented to person, place, and time.  Psychiatric:        Behavior: Behavior normal.      Musculoskeletal Exam: C-spine has limited range of motion.  Postural thoracic kyphosis noted.  Painful range of motion of the lumbar spine.  Some tenderness in the  paraspinal muscles in the lumbar region noted.  No SI joint tenderness noted.  Shoulder joints have good range of motion.  Elbow joints have good range of motion with no tenderness along the elbow joint line.  Wrist joints have good range of motion with no tenderness or synovitis.  CMC joint prominence noted bilaterally.  No tenderness or synovitis over the MCP joints.  Some PIP prominence noted.  Complete fist formation bilaterally.  Left hip replacement has good range of motion.  Right hip joint has good range of motion.  Tenderness over bilateral trochanteric bursa, left greater than  right.  Bilateral knee replacements have good range of motion.  Ankle joints have good range of motion with no joint tenderness.  Pedal edema noted bilaterally.  No tenderness or synovitis over MTP joints.  No evidence of Achilles tendinitis or plantar fasciitis.  CDAI Exam: CDAI Score: -- Patient Global: --; Provider Global: -- Swollen: --; Tender: -- Joint Exam 04/17/2023   No joint exam has been documented for this visit   There is currently no information documented on the homunculus. Go to the Rheumatology activity and complete the homunculus joint exam.  Investigation: No additional findings.  Imaging: XR Foot 2 Views Left  Result Date: 04/17/2023 First MTP narrowing was noted.  PIP and DIP narrowing was noted.  Intertarsal narrowing and dorsal spurring was noted.  No tibiotalar or subtalar joint space narrowing was noted.  Posterior calcaneal spur was noted. 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  XR Foot 2 Views Right  Result Date: 04/17/2023 Narrowing of first and second MTP joints was noted.  PIP and DIP narrowing was noted.  Intertarsal narrowing with dorsal spurring was noted.  No tibiotalar or subtalar joint narrowing was noted.  Inferior calcaneal spur was noted.  No erosive changes were noted. Impression: These findings are consistent  with inflammatory arthritis and osteoarthritis overlap.  XR Hand 2 View Left  Result Date: 04/17/2023 Juxta-articular osteopenia was noted.  Narrowing of all MCP joints was noted.  Intercarpal and radiocarpal joint space narrowing was noted.  Severe CMC narrowing and subluxation with spurring was noted.  PIP and DIP narrowing was noted.  No erosive changes were noted. Impression: These findings are consistent with inflammatory arthritis and osteoarthritis overlap.  XR Hand 2 View Right  Result Date: 04/17/2023 Juxta-articular osteopenia was noted.  Narrowing of first and second MCP joints was noted.  Intercarpal and radiocarpal joint space narrowing was noted.  No erosive changes were noted.  Severe CMC narrowing and subluxation was noted.  PIP and DIP narrowing was noted. Impression: These findings are consistent with inflammatory arthritis and osteoarthritis overlap.   Recent Labs: Lab Results  Component Value Date   WBC 9.8 04/08/2022   HGB 10.1 (L) 04/08/2022   PLT 238 04/08/2022   NA 138 04/08/2022   K 3.0 (L) 04/08/2022   CL 93 (L) 04/08/2022   CO2 39 (H) 04/08/2022   GLUCOSE 105 (H) 04/08/2022   BUN 12 04/08/2022   CREATININE 0.71 04/08/2022   BILITOT 0.9 03/25/2022   ALKPHOS 64 03/25/2022   AST 24 03/25/2022   ALT 13 03/25/2022   PROT 7.6 03/25/2022   ALBUMIN 2.8 (L) 04/05/2022   CALCIUM 9.3 04/08/2022    Speciality Comments: No specialty comments available.  Procedures:  Large Joint Inj: L greater trochanter on 04/17/2023 12:15 PM Indications: pain Details: 27 G 1.5 in needle, lateral approach  Arthrogram: No  Medications: 1.5 mL lidocaine 1 %; 40 mg triamcinolone acetonide 40 MG/ML Aspirate: 0 mL Outcome: tolerated well, no immediate complications Procedure, treatment alternatives, risks and benefits explained, specific risks discussed. Consent was given by the patient. Immediately prior to procedure a time out was called to verify the correct patient, procedure,  equipment, support staff and site/side marked as required. Patient was prepped and draped in the usual sterile fashion.     Allergies: Penicillins   Assessment / Plan:     Visit Diagnoses: Polyarthralgia -Patient has chronic pain involving multiple joints.  Her arthralgias are typically exacerbated by weather changes  or strenuous activity.  She either uses a cane or walker to assist with ambulation.  She experiences interval discomfort in her shoulders, hands, hips, and both feet.  On examination today no synovitis was noted.  She has been taking Aleve twice daily.  X-rays of both hands and feet were obtained today for further evaluation.  The following lab work was also obtained today.  Results will be discussed at her new patient follow-up visit.  Plan: Sedimentation rate, Rheumatoid factor, Cyclic citrul peptide antibody, IgG, Uric acid, C-reactive protein  Pain in both hands - She experiences intermittent pain and stiffness in both hands.  She takes Aleve twice daily.  Most of her discomfort seems to involve the PIP joints.  She has some CMC joint thickening bilaterally.  No tenderness or synovitis over the MCP joints.  Both wrist joints have good range of motion with no tenderness or synovitis.  X-rays of both hands were obtained today and were consistent with inflammatory arthritis and osteoarthritis overlap.  The following lab work was obtained for further evaluation.  Plan to discuss natural anti-inflammatories at her follow-up visit. Plan: XR Hand 2 View Left, XR Hand 2 View Right  Pain in both feet -She experiences intermittent discomfort in both feet.  Pedal edema was noted in bilateral lower extremities.  No tenderness or synovitis over MTP joints noted today.  X-rays of both feet were obtained for further evaluation.  X-ray findings were consistent with inflammatory arthritis and osteoarthritis overlap.  The following lab work was also drawn.  Results will be discussed at the new patient  follow-up visit.  Plan: XR Foot 2 Views Left, XR Foot 2 Views Right  S/P total knee arthroplasty, bilateral - Left knee April 1991, right October 1991. Revision of left-1996.  Doing well.  She uses a cane or walker to assist with ambulation.  S/P total left hip arthroplasty - Performed by Dr. Ophelia Charter on 04/01/22. No groin pain noted today.   Trochanteric bursitis, left hip -She presents today with discomfort in bilateral hips consistent with trochanteric bursitis, left greater than right.  In the past she had a left trochanteric bursa cortisone injection performed by Dr. Olena Leatherwood, which provided temporary relief.  After informed consent the left trochanteric bursa was injected with cortisone.  She tolerated the procedure well.  Procedure note was completed above.  Aftercare was discussed.  She was advised to notify us if her symptoms persist or worsen.  Plan: Large Joint Inj: L greater trochanter  DDD (degenerative disc disease), thoracic - 12/03/22: XR Mild DDD  DDD (degenerative disc disease), lumbar - XR 12/03/22 diffuse advanced DDD.  She continues to have persistent discomfort in her lower back.  At times she has difficulty ambulating due to the discomfort.  She uses a cane or walker to assist with ambulation.  She was given a Salonpas patch sample to try to see if it will alleviate some of her discomfort.  Age-related osteoporosis without current pathological fracture - DEXA 04/14/23-results not available in care everywhere.   Other medical conditions are listed as follows:  Chronic diastolic (congestive) heart failure  Essential hypertension: Blood pressure was 113/64 today in the office.  History of COPD    Orders: Orders Placed This Encounter  Procedures   Large Joint Inj: L greater trochanter   XR Hand 2 View Left   XR Hand 2 View Right   XR Foot 2 Views Left   XR Foot 2 Views Right   Sedimentation rate  Rheumatoid factor   Cyclic citrul peptide antibody, IgG   Uric acid    C-reactive protein   No orders of the defined types were placed in this encounter.   Follow-Up Instructions: Return for Polyarthalgia .   Gearldine Bienenstock, PA-C  Note - This record has been created using Dragon software.  Chart creation errors have been sought, but may not always  have been located. Such creation errors do not reflect on  the standard of medical care.

## 2023-04-17 ENCOUNTER — Ambulatory Visit: Payer: Federal, State, Local not specified - PPO

## 2023-04-17 ENCOUNTER — Ambulatory Visit (INDEPENDENT_AMBULATORY_CARE_PROVIDER_SITE_OTHER): Payer: Federal, State, Local not specified - PPO

## 2023-04-17 ENCOUNTER — Ambulatory Visit: Payer: Federal, State, Local not specified - PPO | Attending: Physician Assistant | Admitting: Physician Assistant

## 2023-04-17 ENCOUNTER — Encounter: Payer: Self-pay | Admitting: Physician Assistant

## 2023-04-17 VITALS — BP 113/64 | HR 66 | Resp 16 | Ht 63.0 in | Wt 232.6 lb

## 2023-04-17 DIAGNOSIS — I10 Essential (primary) hypertension: Secondary | ICD-10-CM

## 2023-04-17 DIAGNOSIS — M7062 Trochanteric bursitis, left hip: Secondary | ICD-10-CM

## 2023-04-17 DIAGNOSIS — M79641 Pain in right hand: Secondary | ICD-10-CM

## 2023-04-17 DIAGNOSIS — Z96653 Presence of artificial knee joint, bilateral: Secondary | ICD-10-CM

## 2023-04-17 DIAGNOSIS — M79671 Pain in right foot: Secondary | ICD-10-CM

## 2023-04-17 DIAGNOSIS — M79642 Pain in left hand: Secondary | ICD-10-CM

## 2023-04-17 DIAGNOSIS — M5134 Other intervertebral disc degeneration, thoracic region: Secondary | ICD-10-CM

## 2023-04-17 DIAGNOSIS — M5136 Other intervertebral disc degeneration, lumbar region: Secondary | ICD-10-CM

## 2023-04-17 DIAGNOSIS — Z96642 Presence of left artificial hip joint: Secondary | ICD-10-CM

## 2023-04-17 DIAGNOSIS — M2550 Pain in unspecified joint: Secondary | ICD-10-CM

## 2023-04-17 DIAGNOSIS — M25473 Effusion, unspecified ankle: Secondary | ICD-10-CM

## 2023-04-17 DIAGNOSIS — M51369 Other intervertebral disc degeneration, lumbar region without mention of lumbar back pain or lower extremity pain: Secondary | ICD-10-CM

## 2023-04-17 DIAGNOSIS — Z8709 Personal history of other diseases of the respiratory system: Secondary | ICD-10-CM

## 2023-04-17 DIAGNOSIS — M810 Age-related osteoporosis without current pathological fracture: Secondary | ICD-10-CM

## 2023-04-17 DIAGNOSIS — M79672 Pain in left foot: Secondary | ICD-10-CM

## 2023-04-17 DIAGNOSIS — I5032 Chronic diastolic (congestive) heart failure: Secondary | ICD-10-CM

## 2023-04-17 LAB — SEDIMENTATION RATE: Sed Rate: 36 mm/h — ABNORMAL HIGH (ref 0–30)

## 2023-04-17 MED ORDER — LIDOCAINE HCL 1 % IJ SOLN
1.5000 mL | INTRAMUSCULAR | Status: AC | PRN
Start: 2023-04-17 — End: 2023-04-17
  Administered 2023-04-17: 1.5 mL

## 2023-04-17 MED ORDER — TRIAMCINOLONE ACETONIDE 40 MG/ML IJ SUSP
40.0000 mg | INTRAMUSCULAR | Status: AC | PRN
Start: 2023-04-17 — End: 2023-04-17
  Administered 2023-04-17: 40 mg via INTRA_ARTICULAR

## 2023-04-18 LAB — URIC ACID: Uric Acid, Serum: 10.4 mg/dL — ABNORMAL HIGH (ref 2.5–7.0)

## 2023-04-18 LAB — C-REACTIVE PROTEIN: CRP: 4.6 mg/L (ref <8.0)

## 2023-04-20 LAB — RHEUMATOID FACTOR: Rheumatoid fact SerPl-aCnc: <10 IU/mL (ref <14)

## 2023-04-20 LAB — CYCLIC CITRUL PEPTIDE ANTIBODY, IGG: Cyclic Citrullin Peptide Ab: <16 UNITS

## 2023-04-21 NOTE — Progress Notes (Signed)
Results will be discussed at new patient follow up visit.

## 2023-04-30 NOTE — Progress Notes (Unsigned)
Office Visit Note  Patient: Meghan Miller             Date of Birth: 01-Mar-1936           MRN: [id number]             PCP: Toma Deiters, MD Referring: Toma Deiters, MD Visit Date: 05/01/2023 Occupation: @GUAROCC @  Subjective:  Discuss results   History of Present Illness: Meghan Miller is a 87 y.o. female with history of osteoarthritis.  Patient presents today to discuss X-ray and lab results from her initial office visit.     Activities of Daily Living:  Patient reports morning stiffness for *** {minute/hour:19697}.   Patient {ACTIONS;DENIES/REPORTS:21021675::"Denies"} nocturnal pain.  Difficulty dressing/grooming: {ACTIONS;DENIES/REPORTS:21021675::"Denies"} Difficulty climbing stairs: {ACTIONS;DENIES/REPORTS:21021675::"Denies"} Difficulty getting out of chair: {ACTIONS;DENIES/REPORTS:21021675::"Denies"} Difficulty using hands for taps, buttons, cutlery, and/or writing: {ACTIONS;DENIES/REPORTS:21021675::"Denies"}  No Rheumatology ROS completed.   PMFS History:  Patient Active Problem List   Diagnosis Date Noted   Hip hematoma, left 04/25/2022   S/P total left hip arthroplasty 04/25/2022   Acute respiratory failure with hypoxia (HCC) 04/04/2022   Essential hypertension 04/04/2022   Goals of care, counseling/discussion 04/04/2022   Bandemia 04/04/2022   Elevated d-dimer 04/04/2022   Class II obesity 04/04/2022   Acute postoperative anemia due to expected blood loss 04/03/2022    Past Medical History:  Diagnosis Date   Anemia    "i used to have this all the time"   Arthritis    Cancer (HCC)    skin cancer- pt has had 2 spots removed from face/ear area (2020 and 2022)   Dyspnea    Hypertension    Peripheral vascular disease (HCC)    "blockages in my legs"- "valves not good"- causes leg swelling per patient    Family History  Problem Relation Age of Onset   Dementia Mother    Heart attack Son    Alcoholism Son    Drug abuse Son    Past Surgical  History:  Procedure Laterality Date   ABDOMINAL HYSTERECTOMY     APPENDECTOMY     appendix removed with tubal ligation   bladder prolapse     BREAST SURGERY     "couple of breast biopsies" on left side per patient   CARDIAC CATHETERIZATION     between 1985-1995. Pt states she had "some blockage in one artery but there was no concern"   CATARACT EXTRACTION Bilateral    "years ago"   CHOLECYSTECTOMY     EYE SURGERY Bilateral 1985   cataract extraction   JOINT REPLACEMENT     both knees replaced in 1991   KNEE SURGERY Left 1996   revision   TONSILLECTOMY     removed as a child   TOTAL HIP ARTHROPLASTY Left 04/01/2022   Procedure: LEFT TOTAL HIP ARTHROPLASTY ANTERIOR APPROACH;  Surgeon: Eldred Manges, MD;  Location: [facility] OR;  Service: Orthopedics;  Laterality: Left;   TUBAL LIGATION  1970   Social History   Social History Narrative   Not on file    There is no immunization history on file for this patient.   Objective: Vital Signs: There were no vitals taken for this visit.   Physical Exam Vitals and nursing note reviewed.  Constitutional:      Appearance: She is well-developed.  HENT:     Head: Normocephalic and atraumatic.  Eyes:     Conjunctiva/sclera: Conjunctivae normal.  Cardiovascular:     Rate and Rhythm: Normal rate and regular rhythm.  Heart sounds: Normal heart sounds.  Pulmonary:     Effort: Pulmonary effort is normal.     Breath sounds: Normal breath sounds.  Abdominal:     General: Bowel sounds are normal.     Palpations: Abdomen is soft.  Musculoskeletal:     Cervical back: Normal range of motion.  Lymphadenopathy:     Cervical: No cervical adenopathy.  Skin:    General: Skin is warm and dry.     Capillary Refill: Capillary refill takes less than 2 seconds.  Neurological:     Mental Status: She is alert and oriented to person, place, and time.  Psychiatric:        Behavior: Behavior normal.      Musculoskeletal Exam: ***  CDAI Exam: CDAI  Score: -- Patient Global: --; Provider Global: -- Swollen: --; Tender: -- Joint Exam 05/01/2023   No joint exam has been documented for this visit   There is currently no information documented on the homunculus. Go to the Rheumatology activity and complete the homunculus joint exam.  Investigation: No additional findings.  Imaging: XR Foot 2 Views Left  Result Date: 04/17/2023 First MTP narrowing was noted.  PIP and DIP narrowing was noted.  Intertarsal narrowing and dorsal spurring was noted.  No tibiotalar or subtalar joint space narrowing was noted.  Posterior calcaneal spur was noted. 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  XR Foot 2 Views Right  Result Date: 04/17/2023 Narrowing of first and second MTP joints was noted.  PIP and DIP narrowing was noted.  Intertarsal narrowing with dorsal spurring was noted.  No tibiotalar or subtalar joint narrowing was noted.  Inferior calcaneal spur was noted.  No erosive changes were noted. Impression: These findings are consistent with inflammatory arthritis and osteoarthritis overlap.  XR Hand 2 View Left  Result Date: 04/17/2023 Juxta-articular osteopenia was noted.  Narrowing of all MCP joints was noted.  Intercarpal and radiocarpal joint space narrowing was noted.  Severe CMC narrowing and subluxation with spurring was noted.  PIP and DIP narrowing was noted.  No erosive changes were noted. Impression: These findings are consistent with inflammatory arthritis and osteoarthritis overlap.  XR Hand 2 View Right  Result Date: 04/17/2023 Juxta-articular osteopenia was noted.  Narrowing of first and second MCP joints was noted.  Intercarpal and radiocarpal joint space narrowing was noted.  No erosive changes were noted.  Severe CMC narrowing and subluxation was noted.  PIP and DIP narrowing was noted. Impression: These findings are consistent with inflammatory arthritis and  osteoarthritis overlap.   Recent Labs: Lab Results  Component Value Date   WBC 9.8 04/08/2022   HGB 10.1 (L) 04/08/2022   PLT 238 04/08/2022   NA 138 04/08/2022   K 3.0 (L) 04/08/2022   CL 93 (L) 04/08/2022   CO2 39 (H) 04/08/2022   GLUCOSE 105 (H) 04/08/2022   BUN 12 04/08/2022   CREATININE 0.71 04/08/2022   BILITOT 0.9 03/25/2022   ALKPHOS 64 03/25/2022   AST 24 03/25/2022   ALT 13 03/25/2022   PROT 7.6 03/25/2022   ALBUMIN 2.8 (L) 04/05/2022   CALCIUM 9.3 04/08/2022    Speciality Comments: No specialty comments available.  Procedures:  No procedures performed Allergies: Penicillins   Assessment / Plan:     Visit Diagnoses: Primary osteoarthritis of both hands - RF-, Anti-CCP-. XR of both hands consistent with inflammatory arthritis and osteoarthritis overlap on 04/17/23.  Primary osteoarthritis of both feet -  XR on 04/17/23 consistent with inflammatory arthritis and osteoarthritis overlap  Elevated sed rate - ESR 36 and CRP WNL on 04/17/23.  Hyperuricemia - Uric acid 10.4 on 04/17/23.  S/P total left hip arthroplasty  S/P total knee arthroplasty, bilateral  DDD (degenerative disc disease), thoracic  DDD (degenerative disc disease), lumbar  Age-related osteoporosis without current pathological fracture  Chronic diastolic (congestive) heart failure (HCC)  Essential hypertension  History of COPD  Orders: No orders of the defined types were placed in this encounter.  No orders of the defined types were placed in this encounter.    Follow-Up Instructions: No follow-ups on file.   Gearldine Bienenstock, PA-C  Note - This record has been created using Dragon software.  Chart creation errors have been sought, but may not always  have been located. Such creation errors do not reflect on  the standard of medical care.,

## 2023-05-01 ENCOUNTER — Ambulatory Visit: Payer: Federal, State, Local not specified - PPO | Attending: Physician Assistant | Admitting: Physician Assistant

## 2023-05-01 ENCOUNTER — Encounter: Payer: Self-pay | Admitting: Physician Assistant

## 2023-05-01 VITALS — BP 101/66 | HR 66 | Resp 15 | Ht 63.0 in | Wt 229.2 lb

## 2023-05-01 DIAGNOSIS — R700 Elevated erythrocyte sedimentation rate: Secondary | ICD-10-CM | POA: Diagnosis not present

## 2023-05-01 DIAGNOSIS — M19042 Primary osteoarthritis, left hand: Secondary | ICD-10-CM

## 2023-05-01 DIAGNOSIS — M5136 Other intervertebral disc degeneration, lumbar region: Secondary | ICD-10-CM

## 2023-05-01 DIAGNOSIS — Z96642 Presence of left artificial hip joint: Secondary | ICD-10-CM

## 2023-05-01 DIAGNOSIS — M7062 Trochanteric bursitis, left hip: Secondary | ICD-10-CM

## 2023-05-01 DIAGNOSIS — E790 Hyperuricemia without signs of inflammatory arthritis and tophaceous disease: Secondary | ICD-10-CM

## 2023-05-01 DIAGNOSIS — M810 Age-related osteoporosis without current pathological fracture: Secondary | ICD-10-CM

## 2023-05-01 DIAGNOSIS — M19072 Primary osteoarthritis, left ankle and foot: Secondary | ICD-10-CM

## 2023-05-01 DIAGNOSIS — M19071 Primary osteoarthritis, right ankle and foot: Secondary | ICD-10-CM | POA: Diagnosis not present

## 2023-05-01 DIAGNOSIS — M5134 Other intervertebral disc degeneration, thoracic region: Secondary | ICD-10-CM

## 2023-05-01 DIAGNOSIS — I10 Essential (primary) hypertension: Secondary | ICD-10-CM

## 2023-05-01 DIAGNOSIS — M19041 Primary osteoarthritis, right hand: Secondary | ICD-10-CM | POA: Diagnosis not present

## 2023-05-01 DIAGNOSIS — I5032 Chronic diastolic (congestive) heart failure: Secondary | ICD-10-CM

## 2023-05-01 DIAGNOSIS — Z96653 Presence of artificial knee joint, bilateral: Secondary | ICD-10-CM

## 2023-05-01 DIAGNOSIS — Z8709 Personal history of other diseases of the respiratory system: Secondary | ICD-10-CM

## 2023-07-02 ENCOUNTER — Ambulatory Visit (INDEPENDENT_AMBULATORY_CARE_PROVIDER_SITE_OTHER): Payer: Federal, State, Local not specified - PPO | Admitting: Orthopaedic Surgery

## 2023-07-02 VITALS — Ht 63.0 in | Wt 234.0 lb

## 2023-07-02 DIAGNOSIS — M5136 Other intervertebral disc degeneration, lumbar region: Secondary | ICD-10-CM | POA: Diagnosis not present

## 2023-07-02 MED ORDER — HYDROCODONE-ACETAMINOPHEN 5-325 MG PO TABS: 1.0000 | ORAL_TABLET | Freq: Four times a day (QID) | ORAL | 0 refills | Status: AC | PRN

## 2023-07-02 MED ORDER — DIAZEPAM 5 MG PO TABS: ORAL_TABLET | ORAL | 0 refills | Status: AC

## 2023-07-02 NOTE — Progress Notes (Signed)
Office Visit Note   Patient: Meghan Miller           Date of Birth: 1936/11/11           MRN: [id number] Visit Date: 07/02/2023              Requested by: Toma Deiters, MD [address] [place],  [place] [address] PCP: Toma Deiters, MD   Assessment & Plan: Visit Diagnoses:  1. Other intervertebral disc degeneration, lumbar region     Plan: Patient requested some more Norco due to her severe pain 20 tablets prescribed to use it sparingly.  MRI scan ordered since he is failed intramuscular cortisone injection Medrol Dosepak muscle relaxants rest, heating pad, topical creams.  Follow-up after MRI scan.  Valium prescribed for claustrophobia.  Follow-Up Instructions: No follow-ups on file.   Orders:  No orders of the defined types were placed in this encounter.  No orders of the defined types were placed in this encounter.     Procedures: No procedures performed   Clinical Data: No additional findings.   Subjective: Chief Complaint  Patient presents with   Lower Back - Pain   Right Leg - Pain    HPI 87 year old female returns she had left total hip arthroplasty by me 1 year ago April 2023 doing well left hip.  He had sudden onset of severe back pain.  3 weeks ago.  She been treated in [place] 4 years ago for back problems had MRI scan and multiple epidurals and facet injections and also facet rhizotomy.  Dr.Hasanaj has treated her with prednisone Dosepak intramuscular injection and prednisone with some improvement.  She has pain shooting down more on the right side in her buttocks and her right greater than left leg.  She has had some neck pain in the past but no numbness or tingling in her arms denies significant neck pain at this time.  She had some hydrocodone she has been ambulating with a rollator and states she cannot walk without it currently.  Plain x-rays demonstrate some degenerative changes thoracic spine and multilevel disc degeneration in the lumbar spine  without fracture or listhesis.  On AP x-ray she had multiple levels with lateral osteophytes and asymmetric disc space narrowing both right and left.  She denies fever chills no bowel bladder symptoms.  No recent falls.  Review of Systems all other systems noncontributory to HPI.   Objective: Vital Signs: Ht 5\' 3"  (1.6 m)   Wt 234 lb (106.1 kg)   BMI 41.45 kg/m   Physical Exam Constitutional:      Appearance: She is well-developed.  HENT:     Head: Normocephalic.     Right Ear: External ear normal.     Left Ear: External ear normal. There is no impacted cerumen.  Eyes:     Pupils: Pupils are equal, round, and reactive to light.  Neck:     Thyroid: No thyromegaly.     Trachea: No tracheal deviation.  Cardiovascular:     Rate and Rhythm: Normal rate.  Pulmonary:     Effort: Pulmonary effort is normal.  Abdominal:     Palpations: Abdomen is soft.  Musculoskeletal:     Cervical back: No rigidity.  Skin:    General: Skin is warm and dry.  Neurological:     Mental Status: She is alert and oriented to person, place, and time.  Psychiatric:        Behavior: Behavior normal.  Ortho Exam no pain with right hip range of motion she has 30 degrees internal rotation without discomfort no hip flexion contracture.  Some strength pain with straight leg raising 90 degrees.  Anterior tib EHL is strong she has trace lower extremity edema right and left symmetrical.  Sciatic notch tenderness significant on the right some trochanteric bursal tenderness.  Specialty Comments:  No specialty comments available.  Imaging: No results found.   PMFS History: Patient Active Problem List   Diagnosis Date Noted   Other intervertebral disc degeneration, lumbar region 07/02/2023   Hip hematoma, left 04/25/2022   S/P total left hip arthroplasty 04/25/2022   Acute respiratory failure with hypoxia (HCC) 04/04/2022   Essential hypertension 04/04/2022   Goals of care, counseling/discussion  04/04/2022   Bandemia 04/04/2022   Elevated d-dimer 04/04/2022   Class II obesity 04/04/2022   Acute postoperative anemia due to expected blood loss 04/03/2022   Past Medical History:  Diagnosis Date   Anemia    "i used to have this all the time"   Arthritis    Cancer (HCC)    skin cancer- pt has had 2 spots removed from face/ear area (2020 and 2022)   Dyspnea    Hypertension    Peripheral vascular disease (HCC)    "blockages in my legs"- "valves not good"- causes leg swelling per patient    Family History  Problem Relation Age of Onset   Dementia Mother    Heart attack Son    Alcoholism Son    Drug abuse Son     Past Surgical History:  Procedure Laterality Date   ABDOMINAL HYSTERECTOMY     APPENDECTOMY     appendix removed with tubal ligation   bladder prolapse     BREAST SURGERY     "couple of breast biopsies" on left side per patient   CARDIAC CATHETERIZATION     between 1985-1995. Pt states she had "some blockage in one artery but there was no concern"   CATARACT EXTRACTION Bilateral    "years ago"   CHOLECYSTECTOMY     EYE SURGERY Bilateral 1985   cataract extraction   JOINT REPLACEMENT     both knees replaced in 1991   KNEE SURGERY Left 1996   revision   TONSILLECTOMY     removed as a child   TOTAL HIP ARTHROPLASTY Left 04/01/2022   Procedure: LEFT TOTAL HIP ARTHROPLASTY ANTERIOR APPROACH;  Surgeon: Eldred Manges, MD;  Location: [facility] OR;  Service: Orthopedics;  Laterality: Left;   TUBAL LIGATION  1970   Social History   Occupational History   Not on file  Tobacco Use   Smoking status: Former    Types: Cigarettes    Quit date: 1989    Years since quitting: 35.5    Passive exposure: Never   Smokeless tobacco: Never  Vaping Use   Vaping Use: Never used  Substance and Sexual Activity   Alcohol use: Yes    Comment: occasionally-[place] when in [place]   Drug use: Never   Sexual activity: Not on file

## 2023-07-04 ENCOUNTER — Telehealth: Payer: Self-pay | Admitting: Orthopaedic Surgery

## 2023-07-04 NOTE — Telephone Encounter (Signed)
Robin [facility] Arrival Specialist) called requesting orders from Dr Ophelia Charter for a pre auth for appt appt. Please call Robin at [phone]. Please fax order to [phone].

## 2023-07-08 NOTE — Telephone Encounter (Signed)
LMOM for Robin to call back and let me know exactly what she needs for this patient

## 2023-07-11 ENCOUNTER — Telehealth: Payer: Self-pay

## 2023-07-11 NOTE — Telephone Encounter (Signed)
Called and left a VM for patient to call back concerning MRI results and appointment.

## 2023-07-16 ENCOUNTER — Encounter: Payer: Self-pay | Admitting: Orthopaedic Surgery

## 2023-07-16 ENCOUNTER — Ambulatory Visit (INDEPENDENT_AMBULATORY_CARE_PROVIDER_SITE_OTHER): Payer: Federal, State, Local not specified - PPO | Admitting: Orthopaedic Surgery

## 2023-07-16 VITALS — Ht 63.0 in | Wt 234.0 lb

## 2023-07-16 DIAGNOSIS — M5136 Other intervertebral disc degeneration, lumbar region: Secondary | ICD-10-CM | POA: Diagnosis not present

## 2023-07-16 NOTE — Progress Notes (Signed)
Office Visit Note   Patient: Meghan Miller           Date of Birth: [birth year]           MRN: [id number] Visit Date: 07/16/2023              Requested by: Toma Deiters, MD [address] [place],  [place] [address] PCP: Toma Deiters, MD   Assessment & Plan: Visit Diagnoses:  1. Other intervertebral disc degeneration, lumbar region     Plan: MRI scan is reviewed.  She has been on Medrol Dosepak, muscle relaxants heating pad, topical cream.  Will set up for an epidural injection with Dr. Alvester Morin.  We discussed the narcotic medicine she has been taking and she needs to avoid this since she is to some refills for her total hip and then small prescriptions for ongoing back pain symptoms.  She may be a candidate for facet rhizotomy like she had in [place].  Follow-Up Instructions: Return in about 4 months (around 11/16/2023).   Orders:  Orders Placed This Encounter  Procedures   Ambulatory referral to Physical Medicine Rehab   No orders of the defined types were placed in this encounter.     Procedures: No procedures performed   Clinical Data: No additional findings.   Subjective: Chief Complaint  Patient presents with   Lower Back - Pain, Follow-up    MRI review    HPI 87 year old female follow-up ongoing problems with low back pain.  She is ambulating with a rollator.  Previous left total hip arthroplasty 1 year ago in April 2023.  She been treated in [place] 4 years ago for back pain and had epidurals facet injections facet rhizotomy pain medication.  Plain radiograph showed some degenerative changes thoracic spine lumbar spine without compression fracture.  Asymmetrical lumbar disc space narrowing both right and left.  Lumbar MRI scan showed moderate to advanced disc degeneration L2-3 through L5-S1.  Review of Systems updated unchanged   Objective: Vital Signs: Ht 5\' 3"  (1.6 m)   Wt 234 lb (106.1 kg)   BMI 41.45 kg/m   Physical Exam Constitutional:       Appearance: She is well-developed.  HENT:     Head: Normocephalic.     Right Ear: External ear normal.     Left Ear: External ear normal.  Eyes:     Pupils: Pupils are equal, round, and reactive to light.  Neck:     Thyroid: No thyromegaly.     Trachea: No tracheal deviation.  Cardiovascular:     Rate and Rhythm: Normal rate.  Pulmonary:     Effort: Pulmonary effort is normal.  Abdominal:     Palpations: Abdomen is soft.  Skin:    General: Skin is warm and dry.  Neurological:     Mental Status: She is alert and oriented to person, place, and time.  Psychiatric:        Behavior: Behavior normal.     Ortho Exam ankle dorsiflexion plantarflexion is intact.  Specialty Comments:  No specialty comments available.  Imaging: MRI Lumbar Spine Wo Contrast  Anatomical Region Laterality Modality  L-spine -- Magnetic Resonance   Impression  1. Moderate to advanced disc degeneration at L2-L3 through L5-S1. 2. Moderate to advanced right facet arthropathy and disc bulges at L4-L5 and L5-S1 resulting in mild-to-moderate right neural foraminal stenosis and right subarticular zone narrowing at L4-L5 which may affect the traversing L5 nerve root, and moderate to severe right neural  foraminal stenosis at L5-S1. 3. Dextrocurvature centered at L2-L3 with 8 mm right lateral listhesis of L2 on L3.   Electronically Signed   By: Lesia Hausen M.D.   On: 07/15/2023 12:57 Narrative  CLINICAL DATA:  Chronic low back pain and sciatica, difficulty walking, unstable on feet.  EXAM: MRI LUMBAR SPINE WITHOUT CONTRAST  TECHNIQUE: Multiplanar, multisequence MR imaging of the lumbar spine was performed. No intravenous contrast was administered.  COMPARISON:  Lumbar spine radiographs 12/03/2022  FINDINGS: Segmentation: Standard; the lowest formed disc space is designated L5-S1.  Alignment: There is dextrocurvature centered at L2-L3 with 8 mm right lateral listhesis of L2 on L3. There is  trace anterolisthesis of L5 on S1.  Vertebrae: Vertebral body heights are preserved. Background marrow signal is normal. Multiple T1 hyperintense lesions throughout the lumbar spine are consistent with benign intraosseous hemangiomas. Multiple Schmorl's nodes are noted. There is no suspicious marrow signal abnormality or marrow edema.  Conus medullaris and cauda equina: Conus extends to the L1-L2 level. Conus and cauda equina appear normal.  Paraspinal and other soft tissues: Unremarkable.  Disc levels:  There is moderate to advanced disc desiccation and narrowing at L2-L3 through L5-S1.  T12-L1: No significant spinal canal or neural foraminal stenosis  L1-L2: There is a mild disc bulge without significant spinal canal or neural foraminal stenosis.  L2-L3: There is a disc bulge and mild bilateral facet arthropathy resulting in mild left subarticular zone narrowing without evidence of nerve root impingement, and no significant neural foraminal stenosis  L3-L4: There is a disc bulge, mild endplate spurring, and mild bilateral facet arthropathy resulting in mild right subarticular zone narrowing without evidence of nerve root impingement, and mild bilateral neural foraminal stenosis  L4-L5: There is a disc bulge eccentric to the right, endplate spurring, and moderate to advanced right and mild left facet arthropathy resulting in right subarticular zone narrowing which may affect the traversing right L5 nerve root, and mild-to-moderate right and mild left neural foraminal stenosis  L5-S1: There is a disc bulge with a central annular fissure and moderate to advanced right and mild left facet arthropathy resulting in moderate to severe right and mild left neural foraminal stenosis without significant spinal canal stenosis. Procedure Note  Erline Hau, MD - 07/15/2023 Formatting of this note might be different from the original. CLINICAL DATA:  Chronic low back pain and  sciatica, difficulty walking, unstable on feet.  EXAM: MRI LUMBAR SPINE WITHOUT CONTRAST  TECHNIQUE: Multiplanar, multisequence MR imaging of the lumbar spine was performed. No intravenous contrast was administered.  COMPARISON:  Lumbar spine radiographs 12/03/2022  FINDINGS: Segmentation: Standard; the lowest formed disc space is designated L5-S1.  Alignment: There is dextrocurvature centered at L2-L3 with 8 mm right lateral listhesis of L2 on L3. There is trace anterolisthesis of L5 on S1.  Vertebrae: Vertebral body heights are preserved. Background marrow signal is normal. Multiple T1 hyperintense lesions throughout the lumbar spine are consistent with benign intraosseous hemangiomas. Multiple Schmorl's nodes are noted. There is no suspicious marrow signal abnormality or marrow edema.  Conus medullaris and cauda equina: Conus extends to the L1-L2 level. Conus and cauda equina appear normal.  Paraspinal and other soft tissues: Unremarkable.  Disc levels:  There is moderate to advanced disc desiccation and narrowing at L2-L3 through L5-S1.  T12-L1: No significant spinal canal or neural foraminal stenosis  L1-L2: There is a mild disc bulge without significant spinal canal or neural foraminal stenosis.  L2-L3: There is a disc bulge  and mild bilateral facet arthropathy resulting in mild left subarticular zone narrowing without evidence of nerve root impingement, and no significant neural foraminal stenosis  L3-L4: There is a disc bulge, mild endplate spurring, and mild bilateral facet arthropathy resulting in mild right subarticular zone narrowing without evidence of nerve root impingement, and mild bilateral neural foraminal stenosis  L4-L5: There is a disc bulge eccentric to the right, endplate spurring, and moderate to advanced right and mild left facet arthropathy resulting in right subarticular zone narrowing which may affect the traversing right L5 nerve  root, and mild-to-moderate right and mild left neural foraminal stenosis  L5-S1: There is a disc bulge with a central annular fissure and moderate to advanced right and mild left facet arthropathy resulting in moderate to severe right and mild left neural foraminal stenosis without significant spinal canal stenosis.  IMPRESSION: 1. Moderate to advanced disc degeneration at L2-L3 through L5-S1. 2. Moderate to advanced right facet arthropathy and disc bulges at L4-L5 and L5-S1 resulting in mild-to-moderate right neural foraminal stenosis and right subarticular zone narrowing at L4-L5 which may affect the traversing L5 nerve root, and moderate to severe right neural foraminal stenosis at L5-S1. 3. Dextrocurvature centered at L2-L3 with 8 mm right lateral listhesis of L2 on L3.   Electronically Signed   By: Lesia Hausen M.D.   On: 07/15/2023 12:57 Exam End: 07/08/23 11:12   Specimen Collected: 07/15/23 12:47 Last Resulted: 07/15/23 12:57  Received From: [facility]  Result Received: 07/16/23 10:17     PMFS History: Patient Active Problem List   Diagnosis Date Noted   Other intervertebral disc degeneration, lumbar region 07/02/2023   Hip hematoma, left 04/25/2022   S/P total left hip arthroplasty 04/25/2022   Acute respiratory failure with hypoxia (HCC) 04/04/2022   Essential hypertension 04/04/2022   Goals of care, counseling/discussion 04/04/2022   Bandemia 04/04/2022   Elevated d-dimer 04/04/2022   Class II obesity 04/04/2022   Acute postoperative anemia due to expected blood loss 04/03/2022   Past Medical History:  Diagnosis Date   Anemia    "i used to have this all the time"   Arthritis    Cancer (HCC)    skin cancer- pt has had 2 spots removed from face/ear area (2020 and 2022)   Dyspnea    Hypertension    Peripheral vascular disease (HCC)    "blockages in my legs"- "valves not good"- causes leg swelling per patient    Family History  Problem Relation Age  of Onset   Dementia Mother    Heart attack Son    Alcoholism Son    Drug abuse Son     Past Surgical History:  Procedure Laterality Date   ABDOMINAL HYSTERECTOMY     APPENDECTOMY     appendix removed with tubal ligation   bladder prolapse     BREAST SURGERY     "couple of breast biopsies" on left side per patient   CARDIAC CATHETERIZATION     between 1985-1995. Pt states she had "some blockage in one artery but there was no concern"   CATARACT EXTRACTION Bilateral    "years ago"   CHOLECYSTECTOMY     EYE SURGERY Bilateral 1985   cataract extraction   JOINT REPLACEMENT     both knees replaced in 1991   KNEE SURGERY Left 1996   revision   TONSILLECTOMY     removed as a child   TOTAL HIP ARTHROPLASTY Left 04/01/2022   Procedure: LEFT TOTAL HIP  ARTHROPLASTY ANTERIOR APPROACH;  Surgeon: Eldred Manges, MD;  Location: [facility];  Service: Orthopedics;  Laterality: Left;   TUBAL LIGATION  1970   Social History   Occupational History   Not on file  Tobacco Use   Smoking status: Former    Current packs/day: 0.00    Types: Cigarettes    Quit date: 1989    Years since quitting: 35.5    Passive exposure: Never   Smokeless tobacco: Never  Vaping Use   Vaping status: Never Used  Substance and Sexual Activity   Alcohol use: Yes    Comment: occasionally-[place] when in [place]   Drug use: Never   Sexual activity: Not on file

## 2023-07-30 ENCOUNTER — Ambulatory Visit: Payer: Federal, State, Local not specified - PPO | Admitting: Orthopaedic Surgery

## 2023-08-14 ENCOUNTER — Ambulatory Visit: Payer: Federal, State, Local not specified - PPO | Admitting: Physical Medicine and Rehabilitation

## 2023-08-14 ENCOUNTER — Other Ambulatory Visit: Payer: Self-pay

## 2023-08-14 VITALS — BP 92/63 | HR 96

## 2023-08-14 DIAGNOSIS — M5416 Radiculopathy, lumbar region: Secondary | ICD-10-CM

## 2023-08-14 MED ORDER — METHYLPREDNISOLONE ACETATE 80 MG/ML IJ SUSP
80.0000 mg | Freq: Once | INTRAMUSCULAR | Status: AC
Start: 2023-08-14 — End: 2023-08-14
  Administered 2023-08-14: 80 mg

## 2023-08-14 NOTE — Progress Notes (Signed)
Functional Pain Scale - descriptive words and definitions  Moderate (4)   Constantly aware of pain, can complete ADLs with modification/sleep marginally affected at times/passive distraction is of no use, but active distraction gives some relief. Moderate range order  Average Pain  varies   +Driver, -BT, -Dye Allergies.  Lower back pain on right side that can radiate to the left side. No radiation in the legs

## 2023-08-14 NOTE — Patient Instructions (Signed)

## 2023-08-29 NOTE — Progress Notes (Signed)
Meghan Miller - 87 y.o. female MRN [id number]  Date of birth: October 14, 1936  Office Visit Note: Visit Date: 08/14/2023 PCP: Toma Deiters, MD Referred by: Toma Deiters, MD  Subjective: Chief Complaint  Patient presents with   Lower Back - Pain   HPI:  Meghan Miller is a 87 y.o. female who comes in today at the request of Dr. Annell Greening for planned Left L5-S1 Lumbar Interlaminar epidural steroid injection with fluoroscopic guidance.  The patient has failed conservative care including home exercise, medications, time and activity modification.  This injection will be diagnostic and hopefully therapeutic.  Please see requesting physician notes for further details and justification.   ROS Otherwise per HPI.  Assessment & Plan: Visit Diagnoses:    ICD-10-CM   1. Lumbar radiculopathy  M54.16 XR C-ARM NO REPORT    Epidural Steroid injection    methylPREDNISolone acetate (DEPO-MEDROL) injection 80 mg      Plan: No additional findings.   Meds & Orders:  Meds ordered this encounter  Medications   methylPREDNISolone acetate (DEPO-MEDROL) injection 80 mg    Orders Placed This Encounter  Procedures   XR C-ARM NO REPORT   Epidural Steroid injection    Follow-up: Return for visit to requesting provider as needed.   Procedures: No procedures performed  Lumbar Epidural Steroid Injection - Interlaminar Approach with Fluoroscopic Guidance  Patient: Meghan Miller      Date of Birth: 1936-03-14 MRN: [id number] PCP: Toma Deiters, MD      Visit Date: 08/14/2023   Universal Protocol:     Consent Given By: the patient  Position: PRONE  Additional Comments: Vital signs were monitored before and after the procedure. Patient was prepped and draped in the usual sterile fashion. The correct patient, procedure, and site was verified.   Injection Procedure Details:   Procedure diagnoses: Lumbar radiculopathy [M54.16]   Meds Administered:  Meds ordered this encounter   Medications   methylPREDNISolone acetate (DEPO-MEDROL) injection 80 mg     Laterality: Left  Location/Site:  L5-S1  Needle: 3.5 in., 20 ga. Tuohy  Needle Placement: Paramedian epidural  Findings:   -Comments: Excellent flow of contrast into the epidural space.  Procedure Details: Using a paramedian approach from the side mentioned above, the region overlying the inferior lamina was localized under fluoroscopic visualization and the soft tissues overlying this structure were infiltrated with 4 ml. of 1% Lidocaine without Epinephrine. The Tuohy needle was inserted into the epidural space using a paramedian approach.   The epidural space was localized using loss of resistance along with counter oblique bi-planar fluoroscopic views.  After negative aspirate for air, blood, and CSF, a 2 ml. volume of Isovue-250 was injected into the epidural space and the flow of contrast was observed. Radiographs were obtained for documentation purposes.    The injectate was administered into the level noted above.   Additional Comments:  No complications occurred Dressing: 2 x 2 sterile gauze and Band-Aid    Post-procedure details: Patient was observed during the procedure. Post-procedure instructions were reviewed.  Patient left the clinic in stable condition.   Clinical History: No specialty comments available.     Objective:  VS:  HT:    WT:   BMI:     BP:92/63  HR:96bpm  TEMP: ( )  RESP:  Physical Exam Vitals and nursing note reviewed.  Constitutional:      General: She is not in acute distress.    Appearance: Normal appearance. She is not  ill-appearing.  HENT:     Head: Normocephalic and atraumatic.     Right Ear: External ear normal.     Left Ear: External ear normal.  Eyes:     Extraocular Movements: Extraocular movements intact.  Cardiovascular:     Rate and Rhythm: Normal rate.     Pulses: Normal pulses.  Pulmonary:     Effort: Pulmonary effort is normal. No  respiratory distress.  Abdominal:     General: There is no distension.     Palpations: Abdomen is soft.  Musculoskeletal:        General: Tenderness present.     Cervical back: Neck supple.     Right lower leg: No edema.     Left lower leg: No edema.     Comments: Patient has good distal strength with no pain over the greater trochanters.  No clonus or focal weakness.  Skin:    Findings: No erythema, lesion or rash.  Neurological:     General: No focal deficit present.     Mental Status: She is alert and oriented to person, place, and time.     Sensory: No sensory deficit.     Motor: No weakness or abnormal muscle tone.     Coordination: Coordination normal.  Psychiatric:        Mood and Affect: Mood normal.        Behavior: Behavior normal.      Imaging: No results found.

## 2023-08-29 NOTE — Procedures (Signed)
Lumbar Epidural Steroid Injection - Interlaminar Approach with Fluoroscopic Guidance  Patient: Meghan Miller      Date of Birth: [birth year] MRN: [id number] PCP: Toma Deiters, MD      Visit Date: 08/14/2023   Universal Protocol:     Consent Given By: the patient  Position: PRONE  Additional Comments: Vital signs were monitored before and after the procedure. Patient was prepped and draped in the usual sterile fashion. The correct patient, procedure, and site was verified.   Injection Procedure Details:   Procedure diagnoses: Lumbar radiculopathy [M54.16]   Meds Administered:  Meds ordered this encounter  Medications   methylPREDNISolone acetate (DEPO-MEDROL) injection 80 mg     Laterality: Left  Location/Site:  L5-S1  Needle: 3.5 in., 20 ga. Tuohy  Needle Placement: Paramedian epidural  Findings:   -Comments: Excellent flow of contrast into the epidural space.  Procedure Details: Using a paramedian approach from the side mentioned above, the region overlying the inferior lamina was localized under fluoroscopic visualization and the soft tissues overlying this structure were infiltrated with 4 ml. of 1% Lidocaine without Epinephrine. The Tuohy needle was inserted into the epidural space using a paramedian approach.   The epidural space was localized using loss of resistance along with counter oblique bi-planar fluoroscopic views.  After negative aspirate for air, blood, and CSF, a 2 ml. volume of Isovue-250 was injected into the epidural space and the flow of contrast was observed. Radiographs were obtained for documentation purposes.    The injectate was administered into the level noted above.   Additional Comments:  No complications occurred Dressing: 2 x 2 sterile gauze and Band-Aid    Post-procedure details: Patient was observed during the procedure. Post-procedure instructions were reviewed.  Patient left the clinic in stable condition.

## 2023-10-22 NOTE — Progress Notes (Deleted)
Office Visit Note  Patient: Meghan Miller             Date of Birth: 02-21-1936           MRN: [id number]             PCP: Toma Deiters, MD Referring: Toma Deiters, MD Visit Date: 11/04/2023 Occupation: @GUAROCC @  Subjective:  No chief complaint on file.   History of Present Illness: Meghan Miller is a 87 y.o. female ***     Activities of Daily Living:  Patient reports morning stiffness for *** {minute/hour:19697}.   Patient {ACTIONS;DENIES/REPORTS:21021675::"Denies"} nocturnal pain.  Difficulty dressing/grooming: {ACTIONS;DENIES/REPORTS:21021675::"Denies"} Difficulty climbing stairs: {ACTIONS;DENIES/REPORTS:21021675::"Denies"} Difficulty getting out of chair: {ACTIONS;DENIES/REPORTS:21021675::"Denies"} Difficulty using hands for taps, buttons, cutlery, and/or writing: {ACTIONS;DENIES/REPORTS:21021675::"Denies"}  No Rheumatology ROS completed.   PMFS History:  Patient Active Problem List   Diagnosis Date Noted   Other intervertebral disc degeneration, lumbar region 07/02/2023   Hip hematoma, left 04/25/2022   S/P total left hip arthroplasty 04/25/2022   Acute respiratory failure with hypoxia (HCC) 04/04/2022   Essential hypertension 04/04/2022   Goals of care, counseling/discussion 04/04/2022   Bandemia 04/04/2022   Elevated d-dimer 04/04/2022   Class II obesity 04/04/2022   Acute postoperative anemia due to expected blood loss 04/03/2022    Past Medical History:  Diagnosis Date   Anemia    "i used to have this all the time"   Arthritis    Cancer (HCC)    skin cancer- pt has had 2 spots removed from face/ear area (2020 and 2022)   Dyspnea    Hypertension    Peripheral vascular disease (HCC)    "blockages in my legs"- "valves not good"- causes leg swelling per patient    Family History  Problem Relation Age of Onset   Dementia Mother    Heart attack Son    Alcoholism Son    Drug abuse Son    Past Surgical History:  Procedure Laterality Date    ABDOMINAL HYSTERECTOMY     APPENDECTOMY     appendix removed with tubal ligation   bladder prolapse     BREAST SURGERY     "couple of breast biopsies" on left side per patient   CARDIAC CATHETERIZATION     between 1985-1995. Pt states she had "some blockage in one artery but there was no concern"   CATARACT EXTRACTION Bilateral    "years ago"   CHOLECYSTECTOMY     EYE SURGERY Bilateral 1985   cataract extraction   JOINT REPLACEMENT     both knees replaced in 1991   KNEE SURGERY Left 1996   revision   TONSILLECTOMY     removed as a child   TOTAL HIP ARTHROPLASTY Left 04/01/2022   Procedure: LEFT TOTAL HIP ARTHROPLASTY ANTERIOR APPROACH;  Surgeon: Eldred Manges, MD;  Location: [facility] OR;  Service: Orthopedics;  Laterality: Left;   TUBAL LIGATION  1970   Social History   Social History Narrative   Not on file    There is no immunization history on file for this patient.   Objective: Vital Signs: There were no vitals taken for this visit.   Physical Exam   Musculoskeletal Exam: ***  CDAI Exam: CDAI Score: -- Patient Global: --; Provider Global: -- Swollen: --; Tender: -- Joint Exam 11/04/2023   No joint exam has been documented for this visit   There is currently no information documented on the homunculus. Go to the Rheumatology activity and complete the homunculus  joint exam.  Investigation: No additional findings.  Imaging: No results found.  Recent Labs: Lab Results  Component Value Date   WBC 9.8 04/08/2022   HGB 10.1 (L) 04/08/2022   PLT 238 04/08/2022   NA 138 04/08/2022   K 3.0 (L) 04/08/2022   CL 93 (L) 04/08/2022   CO2 39 (H) 04/08/2022   GLUCOSE 105 (H) 04/08/2022   BUN 12 04/08/2022   CREATININE 0.71 04/08/2022   BILITOT 0.9 03/25/2022   ALKPHOS 64 03/25/2022   AST 24 03/25/2022   ALT 13 03/25/2022   PROT 7.6 03/25/2022   ALBUMIN 2.8 (L) 04/05/2022   CALCIUM 9.3 04/08/2022    Speciality Comments: No specialty comments  available.  Procedures:  No procedures performed Allergies: Penicillins   Assessment / Plan:     Visit Diagnoses: No diagnosis found.  Orders: No orders of the defined types were placed in this encounter.  No orders of the defined types were placed in this encounter.   Face-to-face time spent with patient was *** minutes. Greater than 50% of time was spent in counseling and coordination of care.  Follow-Up Instructions: No follow-ups on file.   Ellen Henri, CMA  Note - This record has been created using Animal nutritionist.  Chart creation errors have been sought, but may not always  have been located. Such creation errors do not reflect on  the standard of medical care.

## 2023-11-04 ENCOUNTER — Ambulatory Visit: Payer: Federal, State, Local not specified - PPO | Admitting: Rheumatology

## 2023-11-04 DIAGNOSIS — M7062 Trochanteric bursitis, left hip: Secondary | ICD-10-CM

## 2023-11-04 DIAGNOSIS — Z8709 Personal history of other diseases of the respiratory system: Secondary | ICD-10-CM

## 2023-11-04 DIAGNOSIS — M19071 Primary osteoarthritis, right ankle and foot: Secondary | ICD-10-CM

## 2023-11-04 DIAGNOSIS — M5134 Other intervertebral disc degeneration, thoracic region: Secondary | ICD-10-CM

## 2023-11-04 DIAGNOSIS — R700 Elevated erythrocyte sedimentation rate: Secondary | ICD-10-CM

## 2023-11-04 DIAGNOSIS — M810 Age-related osteoporosis without current pathological fracture: Secondary | ICD-10-CM

## 2023-11-04 DIAGNOSIS — Z96653 Presence of artificial knee joint, bilateral: Secondary | ICD-10-CM

## 2023-11-04 DIAGNOSIS — I10 Essential (primary) hypertension: Secondary | ICD-10-CM

## 2023-11-04 DIAGNOSIS — Z96642 Presence of left artificial hip joint: Secondary | ICD-10-CM

## 2023-11-04 DIAGNOSIS — M19041 Primary osteoarthritis, right hand: Secondary | ICD-10-CM

## 2023-11-04 DIAGNOSIS — E790 Hyperuricemia without signs of inflammatory arthritis and tophaceous disease: Secondary | ICD-10-CM

## 2023-11-04 DIAGNOSIS — I5032 Chronic diastolic (congestive) heart failure: Secondary | ICD-10-CM

## 2023-11-04 DIAGNOSIS — M47816 Spondylosis without myelopathy or radiculopathy, lumbar region: Secondary | ICD-10-CM

## 2024-01-27 ENCOUNTER — Other Ambulatory Visit (HOSPITAL_COMMUNITY): Payer: Self-pay | Admitting: Nephrology

## 2024-01-27 DIAGNOSIS — N184 Chronic kidney disease, stage 4 (severe): Secondary | ICD-10-CM

## 2024-02-02 ENCOUNTER — Ambulatory Visit (HOSPITAL_COMMUNITY)
Admission: RE | Admit: 2024-02-02 | Discharge: 2024-02-02 | Disposition: A | Discharge status: Home / Self Care | Payer: Federal, State, Local not specified - PPO | Source: Ambulatory Visit | Attending: Nephrology | Admitting: Nephrology

## 2024-02-02 DIAGNOSIS — N184 Chronic kidney disease, stage 4 (severe): Secondary | ICD-10-CM | POA: Diagnosis present

## 2024-03-08 ENCOUNTER — Telehealth: Payer: Self-pay | Admitting: Orthopaedic Surgery

## 2024-03-08 NOTE — Telephone Encounter (Signed)
 Patient dentist called regarding a call back please [facility] in [place], [place] [phone].

## 2024-03-08 NOTE — Telephone Encounter (Signed)
 I spoke with office, advised Dr. Ophelia Charter does not use premedication prior to dental appointment.
